# Patient Record
Sex: Male | Born: 1955 | Race: Black or African American | Hispanic: No | Marital: Married | Smoking: Never smoker
Health system: Southern US, Community
[De-identification: ages and names within clinical notes are randomized; demographics above are authoritative.]

## PROBLEM LIST (undated history)

## (undated) DIAGNOSIS — Z973 Presence of spectacles and contact lenses: Secondary | ICD-10-CM

## (undated) DIAGNOSIS — E786 Lipoprotein deficiency: Secondary | ICD-10-CM

## (undated) DIAGNOSIS — I10 Essential (primary) hypertension: Secondary | ICD-10-CM

## (undated) DIAGNOSIS — M169 Osteoarthritis of hip, unspecified: Secondary | ICD-10-CM

## (undated) DIAGNOSIS — M25552 Pain in left hip: Secondary | ICD-10-CM

## (undated) DIAGNOSIS — C61 Malignant neoplasm of prostate: Secondary | ICD-10-CM

## (undated) DIAGNOSIS — M25551 Pain in right hip: Secondary | ICD-10-CM

## (undated) DIAGNOSIS — Z8547 Personal history of malignant neoplasm of testis: Secondary | ICD-10-CM

## (undated) DIAGNOSIS — E785 Hyperlipidemia, unspecified: Secondary | ICD-10-CM

## (undated) DIAGNOSIS — K602 Anal fissure, unspecified: Secondary | ICD-10-CM

## (undated) HISTORY — DX: Personal history of malignant neoplasm of testis: Z85.47

## (undated) HISTORY — DX: Lipoprotein deficiency: E78.6

## (undated) HISTORY — DX: Essential (primary) hypertension: I10

## (undated) HISTORY — DX: Malignant neoplasm of prostate: C61

## (undated) HISTORY — DX: Hyperlipidemia, unspecified: E78.5

## (undated) HISTORY — DX: Osteoarthritis of hip, unspecified: M16.9

## (undated) HISTORY — DX: Pain in right hip: M25.551

## (undated) HISTORY — PX: OTHER SURGICAL HISTORY: SHX169

## (undated) HISTORY — DX: Anal fissure, unspecified: K60.2

## (undated) HISTORY — DX: Presence of spectacles and contact lenses: Z97.3

## (undated) HISTORY — DX: Pain in left hip: M25.552

---

## 1978-03-01 DIAGNOSIS — K602 Anal fissure, unspecified: Secondary | ICD-10-CM

## 1978-03-01 HISTORY — DX: Anal fissure, unspecified: K60.2

## 1995-03-02 HISTORY — PX: ORCHIECTOMY: SHX2116

## 1997-10-01 ENCOUNTER — Ambulatory Visit (HOSPITAL_COMMUNITY): Admission: RE | Admit: 1997-10-01 | Discharge: 1997-10-01 | Payer: Self-pay | Admitting: Radiation Oncology

## 1998-05-13 ENCOUNTER — Ambulatory Visit (HOSPITAL_COMMUNITY): Admission: RE | Admit: 1998-05-13 | Discharge: 1998-05-13 | Payer: Self-pay | Admitting: Radiation Oncology

## 1999-05-19 ENCOUNTER — Encounter: Admission: RE | Admit: 1999-05-19 | Discharge: 1999-08-17 | Payer: Self-pay | Admitting: Radiation Oncology

## 2000-05-17 ENCOUNTER — Ambulatory Visit: Admission: RE | Admit: 2000-05-17 | Discharge: 2000-05-17 | Payer: Self-pay | Admitting: Radiation Oncology

## 2001-06-06 ENCOUNTER — Ambulatory Visit (HOSPITAL_COMMUNITY): Admission: RE | Admit: 2001-06-06 | Discharge: 2001-06-06 | Payer: Self-pay | Admitting: Radiation Oncology

## 2008-03-01 HISTORY — PX: PROSTATE BIOPSY: SHX241

## 2008-12-13 ENCOUNTER — Ambulatory Visit: Payer: Self-pay | Admitting: Family Medicine

## 2009-02-14 ENCOUNTER — Ambulatory Visit: Payer: Self-pay | Admitting: Family Medicine

## 2009-07-29 ENCOUNTER — Ambulatory Visit: Payer: Self-pay | Admitting: Family Medicine

## 2010-12-03 ENCOUNTER — Encounter: Payer: Self-pay | Admitting: Family Medicine

## 2010-12-04 ENCOUNTER — Encounter: Payer: Self-pay | Admitting: Internal Medicine

## 2010-12-04 ENCOUNTER — Encounter: Payer: Self-pay | Admitting: Family Medicine

## 2010-12-04 ENCOUNTER — Ambulatory Visit (INDEPENDENT_AMBULATORY_CARE_PROVIDER_SITE_OTHER): Payer: BC Managed Care – PPO | Admitting: Family Medicine

## 2010-12-04 DIAGNOSIS — M129 Arthropathy, unspecified: Secondary | ICD-10-CM

## 2010-12-04 DIAGNOSIS — Z0000 Encounter for general adult medical examination without abnormal findings: Secondary | ICD-10-CM

## 2010-12-04 DIAGNOSIS — M1990 Unspecified osteoarthritis, unspecified site: Secondary | ICD-10-CM

## 2010-12-04 DIAGNOSIS — K922 Gastrointestinal hemorrhage, unspecified: Secondary | ICD-10-CM

## 2010-12-04 DIAGNOSIS — K602 Anal fissure, unspecified: Secondary | ICD-10-CM

## 2010-12-04 DIAGNOSIS — Z23 Encounter for immunization: Secondary | ICD-10-CM

## 2010-12-04 LAB — CBC WITH DIFFERENTIAL/PLATELET
Basophils Absolute: 0 10*3/uL (ref 0.0–0.1)
Basophils Relative: 1 % (ref 0–1)
Eosinophils Absolute: 0.1 10*3/uL (ref 0.0–0.7)
Eosinophils Relative: 2 % (ref 0–5)
HCT: 45.3 % (ref 39.0–52.0)
Hemoglobin: 15.3 g/dL (ref 13.0–17.0)
Lymphocytes Relative: 43 % (ref 12–46)
Lymphs Abs: 2.8 10*3/uL (ref 0.7–4.0)
MCH: 30.8 pg (ref 26.0–34.0)
MCHC: 33.8 g/dL (ref 30.0–36.0)
MCV: 91.1 fL (ref 78.0–100.0)
Monocytes Absolute: 0.4 10*3/uL (ref 0.1–1.0)
Monocytes Relative: 6 % (ref 3–12)
Neutro Abs: 3.2 10*3/uL (ref 1.7–7.7)
Neutrophils Relative %: 49 % (ref 43–77)
Platelets: 195 10*3/uL (ref 150–400)
RBC: 4.97 MIL/uL (ref 4.22–5.81)
RDW: 14.1 % (ref 11.5–15.5)
WBC: 6.5 10*3/uL (ref 4.0–10.5)

## 2010-12-04 LAB — COMPREHENSIVE METABOLIC PANEL
ALT: 16 U/L (ref 0–53)
AST: 14 U/L (ref 0–37)
Albumin: 4.3 g/dL (ref 3.5–5.2)
Alkaline Phosphatase: 51 U/L (ref 39–117)
BUN: 14 mg/dL (ref 6–23)
CO2: 25 mEq/L (ref 19–32)
Calcium: 9.6 mg/dL (ref 8.4–10.5)
Chloride: 104 mEq/L (ref 96–112)
Creat: 1.12 mg/dL (ref 0.50–1.35)
Glucose, Bld: 103 mg/dL — ABNORMAL HIGH (ref 70–99)
Potassium: 4 mEq/L (ref 3.5–5.3)
Sodium: 139 mEq/L (ref 135–145)
Total Bilirubin: 0.9 mg/dL (ref 0.3–1.2)
Total Protein: 7.2 g/dL (ref 6.0–8.3)

## 2010-12-04 LAB — LIPID PANEL
Cholesterol: 152 mg/dL (ref 0–200)
HDL: 25 mg/dL — ABNORMAL LOW (ref >39)
LDL Cholesterol: 93 mg/dL (ref 0–99)
Total CHOL/HDL Ratio: 6.1 Ratio
Triglycerides: 169 mg/dL — ABNORMAL HIGH (ref <150)
VLDL: 34 mg/dL (ref 0–40)

## 2010-12-04 NOTE — Progress Notes (Signed)
  Subjective:    Patient ID: Mark Valentine, male    DOB: May 14, 1955, 55 y.o.   MRN: [id number]  HPI He is here for evaluation of rectal bleeding. He has a previous history of rectal fissure and also states he in the past and had a fistula. Recently he has seen intermittent bright red blood but no pain. He spell no lesions. He has not had a colonoscopy. He has not seen any weight loss or melena. He does have bilateral hip pain and apparently told he needs hip replacements. He did see Dr. Thurston Hole but apparently did not like his personality.  Review of Systems     Objective:   Physical Exam Alert and in no distress. Exam of the anus shows no external lesions. Digital rectal exam did show a linear slightly tender lesion at 6:00. Rectal exam was guaiac positive. No lesions were palpable.       Assessment & Plan:  Guaiac positive stool. Bilateral hip pain. Routine blood screening done. I will also refer for colonoscopy. He will be getting in touch with a different orthopedic surgeon in near future to discuss hip replacement.

## 2010-12-07 ENCOUNTER — Other Ambulatory Visit: Payer: Self-pay

## 2010-12-07 MED ORDER — MELOXICAM 7.5 MG PO TABS: 7.5000 mg | ORAL_TABLET | Freq: Every day | ORAL | Status: DC

## 2010-12-07 NOTE — Telephone Encounter (Signed)
Pt called and med had not been called in

## 2010-12-16 ENCOUNTER — Ambulatory Visit: Payer: BC Managed Care – PPO | Admitting: Internal Medicine

## 2011-01-15 ENCOUNTER — Encounter: Payer: Self-pay | Admitting: *Deleted

## 2011-01-19 ENCOUNTER — Encounter: Payer: Self-pay | Admitting: Gastroenterology

## 2011-01-19 ENCOUNTER — Ambulatory Visit (INDEPENDENT_AMBULATORY_CARE_PROVIDER_SITE_OTHER): Payer: BC Managed Care – PPO | Admitting: Gastroenterology

## 2011-01-19 VITALS — BP 142/86 | HR 88 | Ht 69.5 in | Wt 227.6 lb

## 2011-01-19 DIAGNOSIS — Z8719 Personal history of other diseases of the digestive system: Secondary | ICD-10-CM | POA: Insufficient documentation

## 2011-01-19 DIAGNOSIS — Z9889 Other specified postprocedural states: Secondary | ICD-10-CM

## 2011-01-19 DIAGNOSIS — Z1211 Encounter for screening for malignant neoplasm of colon: Secondary | ICD-10-CM

## 2011-01-19 DIAGNOSIS — R195 Other fecal abnormalities: Secondary | ICD-10-CM | POA: Insufficient documentation

## 2011-01-19 MED ORDER — PEG-KCL-NACL-NASULF-NA ASC-C 100 G PO SOLR: 1.0000 | Freq: Once | ORAL | Status: DC

## 2011-01-19 NOTE — Patient Instructions (Signed)
You have been scheduled for a colonoscopy with propofol. Please follow written instructions given to you at your visit today.  Please pick up your prep kit at the pharmacy within the next 2-3 days.  

## 2011-01-19 NOTE — Progress Notes (Signed)
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 He continues urologic followup with Dr. Marcelyn Bruins, and apparently had a negative prostate biopsy one year ago. He has a history of recurrent rectal fissures with apparently a hysterectomy in 1980 at [facility]. He recently was seen by Dr.LaLonde had a superficial posterior fissure. Patient has occasional constipation but denies abdominal pain, melena, or hematochezia. Family history is noncontributory. He denies upper GI, hepatobiliary, or systemic complaints. He has not had previous colonoscopy. Review of his labs shows no evidence of anemia or abnormal liver function tests. Does take aspirin prophylactically 325 mg a day.  I have reviewed this patient's present history, medical and surgical past history, allergies and medications.     ROS: The remainder of the 10 point ROS is negative     Physical Exam: General well developed well nourished patient in no acute distress, appearing his stated age Eyes PERRLA, no icterus, fundoscopic exam per opthamologist Skin no lesions noted Neck supple, no adenopathy, no thyroid enlargement, no tenderness Chest clear to percussion and auscultation Heart no significant murmurs, gallops or rubs noted Abdomen no hepatosplenomegaly masses or tenderness, BS normal.  Extremities no acute joint lesions, edema, phlebitis or evidence of cellulitis. Neurologic patient oriented x 3, cranial nerves intact, no focal neurologic deficits noted. Psychological mental status normal and normal affect.  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 Apparently his Hemoccult card was positive at primary care. I have scheduled him for colonoscopy with propofol sedation. He is a prominent tongue on exam, but he denies a history of sleep apnea. He does not desire a new local anal creams at this  time.  No diagnosis found.

## 2011-02-01 ENCOUNTER — Ambulatory Visit (AMBULATORY_SURGERY_CENTER): Payer: BC Managed Care – PPO | Admitting: Gastroenterology

## 2011-02-01 ENCOUNTER — Encounter: Payer: Self-pay | Admitting: Gastroenterology

## 2011-02-01 DIAGNOSIS — R195 Other fecal abnormalities: Secondary | ICD-10-CM

## 2011-02-01 DIAGNOSIS — Z1211 Encounter for screening for malignant neoplasm of colon: Secondary | ICD-10-CM

## 2011-02-01 HISTORY — PX: COLONOSCOPY: SHX174

## 2011-02-01 MED ORDER — SODIUM CHLORIDE 0.9 % IV SOLN: 500.0000 mL | INTRAVENOUS | Status: DC

## 2011-02-01 NOTE — Progress Notes (Signed)
Patient did not experience any of the following events: a burn prior to discharge; a fall within the facility; wrong site/side/patient/procedure/implant event; or a hospital transfer or hospital admission upon discharge from the facility. (G8907) Patient did not have preoperative order for IV antibiotic SSI prophylaxis. (G8918)  

## 2011-02-01 NOTE — Patient Instructions (Signed)
NORMAL COLONOSCOPY  SEE BLUE AND GREEN SHEETS FOR ADDITIONAL D/C INSTRUCTIONS

## 2011-02-02 ENCOUNTER — Telehealth: Payer: Self-pay | Admitting: *Deleted

## 2011-02-02 NOTE — Telephone Encounter (Signed)

## 2011-05-18 ENCOUNTER — Emergency Department (HOSPITAL_BASED_OUTPATIENT_CLINIC_OR_DEPARTMENT_OTHER)
Admission: EM | Admit: 2011-05-18 | Discharge: 2011-05-18 | Disposition: A | Discharge status: Home / Self Care | Payer: BC Managed Care – PPO | Attending: Emergency Medicine | Admitting: Emergency Medicine

## 2011-05-18 DIAGNOSIS — M169 Osteoarthritis of hip, unspecified: Secondary | ICD-10-CM | POA: Insufficient documentation

## 2011-05-18 DIAGNOSIS — R5381 Other malaise: Secondary | ICD-10-CM | POA: Insufficient documentation

## 2011-05-18 DIAGNOSIS — I10 Essential (primary) hypertension: Secondary | ICD-10-CM | POA: Insufficient documentation

## 2011-05-18 DIAGNOSIS — M1610 Unilateral primary osteoarthritis, unspecified hip: Secondary | ICD-10-CM | POA: Insufficient documentation

## 2011-05-18 DIAGNOSIS — R6883 Chills (without fever): Secondary | ICD-10-CM | POA: Insufficient documentation

## 2011-05-18 DIAGNOSIS — R197 Diarrhea, unspecified: Secondary | ICD-10-CM

## 2011-05-18 LAB — CBC
MCH: 31 pg (ref 26.0–34.0)
MCHC: 34.1 g/dL (ref 30.0–36.0)
Platelets: 166 10*3/uL (ref 150–400)
RDW: 13.9 % (ref 11.5–15.5)

## 2011-05-18 LAB — URINALYSIS, ROUTINE W REFLEX MICROSCOPIC
Glucose, UA: NEGATIVE mg/dL
Ketones, ur: NEGATIVE mg/dL
Leukocytes, UA: NEGATIVE
Specific Gravity, Urine: 1.026 (ref 1.005–1.030)
pH: 5.5 (ref 5.0–8.0)

## 2011-05-18 LAB — COMPREHENSIVE METABOLIC PANEL
ALT: 20 U/L (ref 0–53)
Albumin: 3.6 g/dL (ref 3.5–5.2)
Alkaline Phosphatase: 49 U/L (ref 39–117)
Potassium: 3.9 mEq/L (ref 3.5–5.1)
Sodium: 138 mEq/L (ref 135–145)
Total Protein: 6.9 g/dL (ref 6.0–8.3)

## 2011-05-18 LAB — DIFFERENTIAL
Basophils Relative: 0 % (ref 0–1)
Eosinophils Absolute: 0 10*3/uL (ref 0.0–0.7)
Neutrophils Relative %: 67 % (ref 43–77)

## 2011-05-18 MED ORDER — SODIUM CHLORIDE 0.9 % IV BOLUS (SEPSIS)
1000.0000 mL | Freq: Once | INTRAVENOUS | Status: AC
  Administered 2011-05-18: 1000 mL via INTRAVENOUS

## 2011-05-18 NOTE — ED Provider Notes (Signed)
History     CSN: [id number]  Arrival date & time 05/18/11  1858   First MD Initiated Contact with Patient 05/18/11 2001      Chief Complaint  Patient presents with  . Diarrhea    Pt. reports he ate chicken from a service station last night.    (Consider location/radiation/quality/duration/timing/severity/associated sxs/prior treatment) HPI  C/o diarrhea since this morning 0300. Multiple episodes of NB diarrhea, watery and loose stool. Denies vomiting.  Has been taking immodium and gatorade. +subj fever +chills.  Wife sick one week ago with diarrhea. Also sick contacts- children at work. Also had chicken "that I thought was bad, I didn't eat it all". Diffuse weakness since onset. Denies hematuria/dysuria/freq/urgency. No recent abx use. No recent travel.  Past Medical History  Diagnosis Date  . Osteoarthritis of hip   . Hypertension   . Lipoprotein deficiencies   . History of testicular cancer   . Anal fissure 1980    Past Surgical History  Procedure Date  . Prostate biopsy 2010  . Orchiectomy 1997  . Anal fistula repair 1980's    Family History  Problem Relation Age of Onset  . Arthritis Mother   . Diabetes Mother   . Stroke Mother   . Hypertension Mother   . Heart disease Father   . Hypertension Sister   . Mental illness Sister   . Diabetes Sister   . Hypertension Brother     History  Substance Use Topics  . Smoking status: Never Smoker   . Smokeless tobacco: Never Used  . Alcohol Use: No      Review of Systems  All other systems reviewed and are negative.  except as noted HPI   Allergies  Review of patient's allergies indicates no known allergies.  Home Medications   Current Outpatient Rx  Name Route Sig Dispense Refill  . IBUPROFEN 200 MG PO TABS Oral Take 400 mg by mouth every 6 (six) hours as needed. For pain       BP 129/71  Pulse 74  Temp(Src) 99.7 F (37.6 C) (Oral)  Resp 18  Ht 5\' 9"  (1.753 m)  Wt 252 lb (114.306 kg)  BMI  37.21 kg/m2  SpO2 99%  Physical Exam  Nursing note and vitals reviewed. Constitutional: He is oriented to person, place, and time. He appears well-developed and well-nourished. No distress.  HENT:  Head: Atraumatic.       Mm dry  Eyes: Conjunctivae are normal. Pupils are equal, round, and reactive to light.  Neck: Neck supple.  Cardiovascular: Normal rate, regular rhythm, normal heart sounds and intact distal pulses.  Exam reveals no gallop and no friction rub.   No murmur heard. Pulmonary/Chest: Effort normal. No respiratory distress. He has no wheezes. He has no rales.  Abdominal: Soft. Bowel sounds are normal. There is no tenderness. There is no rebound and no guarding.  Musculoskeletal: Normal range of motion. He exhibits no edema and no tenderness.  Neurological: He is alert and oriented to person, place, and time.  Skin: Skin is warm and dry.  Psychiatric: He has a normal mood and affect.    ED Course  Procedures (including critical care time)  Labs Reviewed  DIFFERENTIAL - Abnormal; Notable for the following:    Monocytes Relative 14 (*)    All other components within normal limits  COMPREHENSIVE METABOLIC PANEL - Abnormal; Notable for the following:    Glucose, Bld 115 (*)    Creatinine, Ser 1.40 (*)  GFR calc non Af Amer 56 (*)    GFR calc Af Amer 64 (*)    All other components within normal limits  URINALYSIS, ROUTINE W REFLEX MICROSCOPIC - Abnormal; Notable for the following:    APPearance CLOUDY (*)    All other components within normal limits  CBC   No results found.   1. Diarrhea     MDM  Multiple episodes of NB diarrhea. No fever. Likely noninfectious. No recent antibiotic use. Feels better after hydration with IVF. Labs unremarkable. Plans for discharge home with PMD f/u for recheck Cr within one week. Patient aware.  [facility]      Forbes Cellar, MD 05/18/11 [phone]

## 2011-05-18 NOTE — ED Notes (Signed)
Pt. Reports he has had no solid food since the chicken he ate last night.  Pt. Reports he has had some gatoraid and ginger ale and then diarrhea.

## 2011-05-18 NOTE — Discharge Instructions (Signed)
Diarrhea Infections caused by germs (bacterial) or a virus commonly cause diarrhea. Your caregiver has determined that with time, rest and fluids, the diarrhea should improve. In general, eat normally while drinking more water than usual. Although water may prevent dehydration, it does not contain salt and minerals (electrolytes). Broths, weak tea without caffeine and oral rehydration solutions (ORS) replace fluids and electrolytes. Small amounts of fluids should be taken frequently. Large amounts at one time may not be tolerated. Plain water may be harmful in infants and the elderly. Oral rehydrating solutions (ORS) are available at pharmacies and grocery stores. ORS replace water and important electrolytes in proper proportions. Sports drinks are not as effective as ORS and may be harmful due to sugars worsening diarrhea.  ORS is especially recommended for use in children with diarrhea. As a general guideline for children, replace any new fluid losses from diarrhea and/or vomiting with ORS as follows:   If your child weighs 22 pounds or under (10 kg or less), give 60-120 mL ( -  cup or 2 - 4 ounces) of ORS for each episode of diarrheal stool or vomiting episode.   If your child weighs more than 22 pounds (more than 10 kgs), give 120-240 mL ( - 1 cup or 4 - 8 ounces) of ORS for each diarrheal stool or episode of vomiting.   While correcting for dehydration, children should eat normally. However, foods high in sugar should be avoided because this may worsen diarrhea. Large amounts of carbonated soft drinks, juice, gelatin desserts and other highly sugared drinks should be avoided.   After correction of dehydration, other liquids that are appealing to the child may be added. Children should drink small amounts of fluids frequently and fluids should be increased as tolerated. Children should drink enough fluids to keep urine clear or pale yellow.   Adults should eat normally while drinking more fluids  than usual. Drink small amounts of fluids frequently and increase as tolerated. Drink enough fluids to keep urine clear or pale yellow. Broths, weak decaffeinated tea, lemon lime soft drinks (allowed to go flat) and ORS replace fluids and electrolytes.   Avoid:   Carbonated drinks.   Juice.   Extremely hot or cold fluids.   Caffeine drinks.   Fatty, greasy foods.   Alcohol.   Tobacco.   Too much intake of anything at one time.   Gelatin desserts.   Probiotics are active cultures of beneficial bacteria. They may lessen the amount and number of diarrheal stools in adults. Probiotics can be found in yogurt with active cultures and in supplements.   Wash hands well to avoid spreading bacteria and virus.   Anti-diarrheal medications are not recommended for infants and children.   Only take over-the-counter or prescription medicines for pain, discomfort or fever as directed by your caregiver. Do not give aspirin to children because it may cause Reye's Syndrome.   For adults, ask your caregiver if you should continue all prescribed and over-the-counter medicines.   If your caregiver has given you a follow-up appointment, it is very important to keep that appointment. Not keeping the appointment could result in a chronic or permanent injury, and disability. If there is any problem keeping the appointment, you must call back to this facility for assistance.  SEEK IMMEDIATE MEDICAL CARE IF:   You or your child is unable to keep fluids down or other symptoms or problems become worse in spite of treatment.   Vomiting or diarrhea develops and becomes persistent.     There is vomiting of blood or bile (green material).   There is blood in the stool or the stools are black and tarry.   There is no urine output in 6-8 hours or there is only a small amount of very dark urine.   Abdominal pain develops, increases or localizes.   You have a fever.   Your baby is older than 3 months with a  rectal temperature of 102 F (38.9 C) or higher.   Your baby is 3 months old or younger with a rectal temperature of 100.4 F (38 C) or higher.   You or your child develops excessive weakness, dizziness, fainting or extreme thirst.   You or your child develops a rash, stiff neck, severe headache or become irritable or sleepy and difficult to awaken.  MAKE SURE YOU:   Understand these instructions.   Will watch your condition.   Will get help right away if you are not doing well or get worse.  Document Released: 02/05/2002 Document Revised: 02/04/2011 Document Reviewed: 12/23/2008 Exit[employer] Patient Information 2012 [employer]. 

## 2011-05-18 NOTE — ED Notes (Signed)
Pt. Reports diarrhea since 3am.  More than 5 diarrhea stools.  Pt. Has had 250bolus by EMS.

## 2011-05-31 ENCOUNTER — Encounter: Payer: Self-pay | Admitting: Medical

## 2011-05-31 ENCOUNTER — Ambulatory Visit (INDEPENDENT_AMBULATORY_CARE_PROVIDER_SITE_OTHER): Payer: BC Managed Care – PPO | Admitting: Medical

## 2011-05-31 VITALS — BP 138/82 | HR 72 | Temp 98.3°F | Resp 16 | Wt 220.0 lb

## 2011-05-31 DIAGNOSIS — R799 Abnormal finding of blood chemistry, unspecified: Secondary | ICD-10-CM

## 2011-05-31 DIAGNOSIS — R7989 Other specified abnormal findings of blood chemistry: Secondary | ICD-10-CM

## 2011-05-31 DIAGNOSIS — I10 Essential (primary) hypertension: Secondary | ICD-10-CM

## 2011-05-31 LAB — BASIC METABOLIC PANEL
CO2: 25 mEq/L (ref 19–32)
Chloride: 107 mEq/L (ref 96–112)
Creat: 1.07 mg/dL (ref 0.50–1.35)
Potassium: 3.9 mEq/L (ref 3.5–5.3)

## 2011-05-31 NOTE — Progress Notes (Signed)
Subjective:   HPI  Mark Valentine is a 56 y.o. male who presents for hospital follow up.  He was seen 05/18/11 at the med [facility] emergency department for acute viral gastroenteritis.  He was treated there with IV fluids over the course of about 8 hours then released. He apparently had elevated creatinine, and they advised he return here within a week for recheck.  He denies any other prior history of elevated creatinine. He also has questions about blood pressure. In the past he had been on medication briefly for high blood pressure throughout doctors here, however, he feels like it is just white coat hypertension. His pressure was elevated at the emergency department, but at the time of discharge it was back down to normal. He notes that he checks his blood pressure at home about 3 times per week, and he always has SBP in the 120s, DBPs in the 60 to 70s.  He does exercise some, tries to eat somewhat healthy.  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  He used to but be very active in martial arts.  No other c/o.  The following portions of the patient's history were reviewed and updated as appropriate: allergies, current medications, past family history, past medical history, past social history, past surgical history and problem list.  Past Medical History  Diagnosis Date  . Osteoarthritis of hip   . Hypertension   . Lipoprotein deficiencies   . History of testicular cancer   . Anal fissure 1980    No Known Allergies   Review of Systems ROS reviewed and was negative other than noted in HPI or above.    Objective:   Physical Exam  General appearance: alert, no distress, WD/WN Oral cavity: MMM, no lesions Neck: supple, no lymphadenopathy, no thyromegaly, no masses, no bruits Heart: RRR, normal S1, S2, no murmurs Lungs: CTA bilaterally, no wheezes, rhonchi, or rales Abdomen: +bs, soft, non tender, non distended,  no masses, no hepatomegaly, no splenomegaly Pulses: 2+ symmetric, upper and lower extremities, normal cap refill   Assessment and Plan :     Encounter Diagnoses  Name Primary?  . Essential hypertension, benign Yes  . Elevated serum creatinine     Hypertension-based on his home readings, he has been running a normal blood pressure.  His recent pressures here have been either normal or borderline, but he has had higher pressure readings back in 2010 when he was on medication.  He probably does have whitecoat hypertension.  At this point we will use a watch and wait approach. We discussed the risk of uncontrolled high blood pressure. We discussed lifestyle changes, regular exercise, eating healthy, avoiding salt, and recommend he try and lose at least 10 pounds. Recheck in a few months for a physical.  Of note, he may be having orthopedic surgery soon, and advise that this may be a good time to have a preventative appt/physical/pre surgery physical  Elevated serum creatinine-this was likely due to dehydration with gastroenteritis.  I reviewed the recent labs and emergency department visit notes, and we recheck a BMET today.  Of note his gastroenteritis has resolved completely.

## 2011-07-20 ENCOUNTER — Telehealth: Payer: Self-pay | Admitting: Internal Medicine

## 2011-07-20 NOTE — Telephone Encounter (Signed)
pls get the handicap sticker form from up front office, let me sign and we can fax/return this.

## 2011-07-20 NOTE — Telephone Encounter (Signed)
pt states got a new job and works in [employer] and has to have both hips replaced and pt wants to know if you will sign for him a handicap sticker. he has to build up time before he can have his surgery. please call pt to advise. he said he could mail [place] the papers or have his wife bring them to [place].

## 2011-07-21 NOTE — Telephone Encounter (Signed)
dmv form to chandra

## 2012-07-14 ENCOUNTER — Encounter: Payer: Self-pay | Admitting: Medical

## 2012-07-14 ENCOUNTER — Ambulatory Visit (INDEPENDENT_AMBULATORY_CARE_PROVIDER_SITE_OTHER): Payer: BC Managed Care – PPO | Admitting: Medical

## 2012-07-14 VITALS — BP 130/80 | HR 60 | Temp 97.7°F | Resp 16 | Ht 68.5 in | Wt 234.0 lb

## 2012-07-14 DIAGNOSIS — R972 Elevated prostate specific antigen [PSA]: Secondary | ICD-10-CM

## 2012-07-14 DIAGNOSIS — E669 Obesity, unspecified: Secondary | ICD-10-CM

## 2012-07-14 DIAGNOSIS — Z0000 Encounter for general adult medical examination without abnormal findings: Secondary | ICD-10-CM

## 2012-07-14 DIAGNOSIS — M25551 Pain in right hip: Secondary | ICD-10-CM

## 2012-07-14 DIAGNOSIS — M25552 Pain in left hip: Secondary | ICD-10-CM

## 2012-07-14 DIAGNOSIS — M25559 Pain in unspecified hip: Secondary | ICD-10-CM

## 2012-07-14 DIAGNOSIS — R9431 Abnormal electrocardiogram [ECG] [EKG]: Secondary | ICD-10-CM

## 2012-07-14 DIAGNOSIS — R7301 Impaired fasting glucose: Secondary | ICD-10-CM

## 2012-07-14 LAB — COMPREHENSIVE METABOLIC PANEL
Albumin: 4.2 g/dL (ref 3.5–5.2)
BUN: 12 mg/dL (ref 6–23)
CO2: 28 mEq/L (ref 19–32)
Calcium: 9.6 mg/dL (ref 8.4–10.5)
Chloride: 103 mEq/L (ref 96–112)
Glucose, Bld: 100 mg/dL — ABNORMAL HIGH (ref 70–99)
Potassium: 4.2 mEq/L (ref 3.5–5.3)
Total Protein: 6.8 g/dL (ref 6.0–8.3)

## 2012-07-14 LAB — CBC WITH DIFFERENTIAL/PLATELET
Hemoglobin: 15.9 g/dL (ref 13.0–17.0)
Lymphs Abs: 2.9 10*3/uL (ref 0.7–4.0)
Monocytes Relative: 7 % (ref 3–12)
Neutro Abs: 3.4 10*3/uL (ref 1.7–7.7)
Neutrophils Relative %: 48 % (ref 43–77)
Platelets: 199 10*3/uL (ref 150–400)
RBC: 5.18 MIL/uL (ref 4.22–5.81)
WBC: 7.1 10*3/uL (ref 4.0–10.5)

## 2012-07-14 LAB — POCT URINALYSIS DIPSTICK
Bilirubin, UA: NEGATIVE
Blood, UA: NEGATIVE
Glucose, UA: NEGATIVE
Ketones, UA: NEGATIVE
Spec Grav, UA: <=1.005
Urobilinogen, UA: NEGATIVE

## 2012-07-14 LAB — HEMOGLOBIN A1C: Mean Plasma Glucose: 117 mg/dL — ABNORMAL HIGH (ref <117)

## 2012-07-14 LAB — LIPID PANEL
Cholesterol: 155 mg/dL (ref 0–200)
Total CHOL/HDL Ratio: 6.2 Ratio
Triglycerides: 166 mg/dL — ABNORMAL HIGH (ref <150)
VLDL: 33 mg/dL (ref 0–40)

## 2012-07-14 MED ORDER — HYDROCODONE-ACETAMINOPHEN 5-325 MG PO TABS: 1.0000 | ORAL_TABLET | Freq: Four times a day (QID) | ORAL | Status: DC | PRN

## 2012-07-14 NOTE — Progress Notes (Signed)
Subjective:   HPI  Mark Valentine is a 57 y.o. male who presents for a complete physical.   Preventative care: Last ophthalmology visit: Last dental visit:yes- Dr. Amie Critchley Last colonoscopy:01/2011 Last prostate exam: 01/2011 Last EKG:12/13/08 Last labs:2013  Prior vaccinations: TD or Tdap:patient declined Influenza:2012 Pneumococcal:N/a Shingles/Zostavax:N/A  Advanced directive:N/a Health care power of attorney:N/a Living will:N/a  Concerns: bilat hip pain ongoing, saw ortho prior that recommended bilat hip surgery, but he wants to see different orthopedist as he wasn't comfortable with the surgeon.  Took so much NSAIDS in August 07, 2010 that he had colonoscopy due to blood in stool.   Reviewed their medical, surgical, family, social, medication, and allergy history and updated chart as appropriate.   Past Medical History  Diagnosis Date  . Osteoarthritis of hip   . Hypertension   . Lipoprotein deficiencies   . History of testicular cancer   . Anal fissure 1980  . Low HDL (under 40)   . Wears glasses   . Hip pain, bilateral   . Prostate nodule     elevated PSA, followed by Urology, Dr. Logan Bores    Past Surgical History  Procedure Laterality Date  . Prostate biopsy  06-Aug-2008    Dr. Logan Bores, Urology  . Orchiectomy  1997  . Anal fistula repair  1980's  . Colonoscopy  02/01/11    normal, repeat 06-Aug-2020; Dr. Jarold Motto    Family History  Problem Relation Age of Onset  . Arthritis Mother   . Diabetes Mother   . Stroke Mother   . Hypertension Mother   . Heart disease Father     aortic disease, possible dissection  . Hypertension Sister   . Mental illness Sister   . Diabetes Sister   . Hypertension Brother     History   Social History  . Marital Status: Widowed    Spouse Name: N/A    Number of Children: 1  . Years of Education: N/A   Occupational History  . Not on file.   Social History Main Topics  . Smoking status: Never Smoker   . Smokeless tobacco: Never Used   . Alcohol Use: No  . Drug Use: No  . Sexually Active: Not on file   Other Topics Concern  . Not on file   Social History Narrative   Married, limited exercise, former judge.  First wife passed away 07-Aug-1999.   Owns a Engineer, maintenance.  Commutes to [place] daily    No current outpatient prescriptions on file prior to visit.   No current facility-administered medications on file prior to visit.    No Known Allergies   Review of Systems Constitutional: -fever, -chills, -sweats, -unexpected weight change, -decreased appetite, -fatigue Allergy: -sneezing, -itching, -congestion Dermatology: -changing moles, --rash, -lumps ENT: -runny nose, -ear pain, -sore throat, -hoarseness, -sinus pain, -teeth pain, - ringing in ears, -hearing loss, -nosebleeds Cardiology: -chest pain, -palpitations, -swelling, -difficulty breathing when lying flat, -waking up short of breath Respiratory: -cough, -shortness of breath, -difficulty breathing with exercise or exertion, -wheezing, -coughing up blood Gastroenterology: -abdominal pain, -nausea, -vomiting, -diarrhea, -constipation, -blood in stool, -changes in bowel movement, -difficulty swallowing or eating Hematology: -bleeding, -bruising  Musculoskeletal: +joint aches, -muscle aches, -joint swelling, -back pain, -neck pain, -cramping, -changes in gait Ophthalmology: denies vision changes, eye redness, itching, discharge Urology: -burning with urination, -difficulty urinating, -blood in urine, -urinary frequency, -urgency, -incontinence Neurology: -headache, -weakness, -tingling, -numbness, -memory loss, -falls, -dizziness Psychology: -depressed mood, -agitation, -sleep problems     Objective:  Physical Exam  Filed Vitals:   07/14/12 0827  BP: 130/80  Pulse: 60  Temp: 97.7 F (36.5 C)  Resp: 16    General appearance: alert, no distress, WD/WN, AA male Skin: right upper back with medium to large pedunculated skin tag, several small skin tags  along neck, no worrisome lesions HEENT: normocephalic, conjunctiva/corneas normal, sclerae anicteric, PERRLA, EOMi, nares patent, no discharge or erythema, pharynx normal Oral cavity: MMM, tongue normal, teeth in good repair Neck: supple, no lymphadenopathy, no thyromegaly, no masses, normal ROM, no bruits Chest: non tender, normal shape and expansion Heart: RRR, normal S1, S2, no murmurs Lungs: CTA bilaterally, no wheezes, rhonchi, or rales Abdomen: +bs, soft, non tender, non distended, no masses, no hepatomegaly, no splenomegaly, no bruits Back: non tender, normal ROM, no scoliosis Musculoskeletal: limited bilat hip ROM, pain with hip ROM bilat, othewrise upper extremities non tender, no obvious deformity, normal ROM throughout, lower extremities non tender, no obvious deformity, normal ROM throughout Extremities: no edema, no cyanosis, no clubbing Pulses: 2+ symmetric, upper and lower extremities, normal cap refill Neurological: alert, oriented x 3, CN2-12 intact, strength normal upper extremities and lower extremities, sensation normal throughout, DTRs 2+ throughout, no cerebellar signs, gait normal Psychiatric: normal affect, behavior normal, pleasant  GU: normal male external genitalia, uncircumcised, only 1 testes present s/p orchiectomy, otherwise nontender, no masses, no hernia, no lymphadenopathy Rectal: anus normal tone, prostate firm, right sided nodule, occult negative stool   Adult ECG Report  Indication: physical, 2010 EKG with AV block type I  Rate: 63 bpm  Rhythm: normal sinus rhythm  QRS Axis: 46 degrees  PR Interval: 200 ms  QRS Duration: 78ms  QTc:  Conduction Disturbances: first-degree A-V block   Other Abnormalities: none  Patient's cardiac risk factors are: advanced age (older than 24 for men, 70 for women), hypertension, male gender, obesity (BMI >= 30 kg/m2) and sedentary lifestyle.  EKG comparison: 2010, unchanged  Narrative Interpretation: borderline AV  block type 1     Assessment and Plan :      Encounter Diagnoses  Name Primary?  . Routine general medical examination at a health care facility Yes  . Hip pain, bilateral   . Abnormal PSA   . Obesity, unspecified   . Impaired fasting blood sugar   . Nonspecific abnormal electrocardiogram (ECG) (EKG)     Physical exam - discussed healthy lifestyle, diet, exercise, preventative care, vaccinations, and addressed their concerns.  Advised yearly eye doctor visit.  He declines Tdap and vaccines although he is past due on Tdap.  Hip pain - prn use of Lortab, refer to Dr. Charlann Boxer, [facility]  Abnormal PSA, prostate nodule - will reqeust most recent urology notes  Obesity - consider swimming for exercise, reduced calorie diet to lose weight  impaired fasting glucose - labs today  reviewed EKG today compared to 2010 EKG   Follow-up pending labs

## 2012-07-14 NOTE — Addendum Note (Signed)
Addended by: Jac Canavan on: 07/14/2012 09:25 AM   Modules accepted: Orders

## 2012-07-18 ENCOUNTER — Other Ambulatory Visit: Payer: Self-pay | Admitting: Medical

## 2012-07-18 ENCOUNTER — Telehealth: Payer: Self-pay | Admitting: Family Medicine

## 2012-07-18 MED ORDER — NIACIN ER (ANTIHYPERLIPIDEMIC) 500 MG PO TBCR: 500.0000 mg | EXTENDED_RELEASE_TABLET | Freq: Every day | ORAL | Status: DC

## 2012-07-18 NOTE — Telephone Encounter (Signed)
Patient is aware of his appointment to see Dr. Charlann Boxer on June 2, 14 @ 800 am. [facility]. [phone]   Fax everything over through [facility]. CLS

## 2012-10-09 ENCOUNTER — Telehealth: Payer: Self-pay | Admitting: Medical

## 2012-10-09 NOTE — Telephone Encounter (Signed)
Pt was called and informed that Ernst Breach PA-C did receive the parking placard. Vincenza Hews did inform me thru a staff message that this would need to be filled out be Dr. Charlann Boxer.    I received handicap placard request. I assume this is related to hip pain. If so, he needs to send this to Dr. Charlann Boxer as he can give a better assessment of his disability or potential for long term disability.       Message was left for pt informing him of above message.

## 2012-11-23 ENCOUNTER — Ambulatory Visit (INDEPENDENT_AMBULATORY_CARE_PROVIDER_SITE_OTHER): Payer: BC Managed Care – PPO | Admitting: Family Medicine

## 2012-11-23 ENCOUNTER — Encounter: Payer: Self-pay | Admitting: Family Medicine

## 2012-11-23 VITALS — BP 130/80 | HR 70 | Temp 98.2°F | Wt 236.0 lb

## 2012-11-23 DIAGNOSIS — J069 Acute upper respiratory infection, unspecified: Secondary | ICD-10-CM

## 2012-11-23 NOTE — Progress Notes (Signed)
  Subjective:    Patient ID: Mark Valentine, male    DOB: [birth year], 56 y.o.   MRN: [id number]  HPI I've days ago he started having difficulty with a sore throat followed by PND, hoarse voice ,fatigue , malaise,diaphoresis. No cough, earache or chest congestion. He states he gets these symptoms usually twice a year usually in spring and fall. He is not having any sneezing, itchy watery eyes. He did try Zyrtec. He worked only one half day this week.   Review of Systems     Objective:   Physical Exam alert and in no distress. Tympanic membranes and canals are normal. Throat is clear. Tonsils are normal. Neck is supple without adenopathy or thyromegaly. Cardiac exam shows a regular sinus rhythm without murmurs or gallops. Lungs are clear to auscultation.        Assessment & Plan:  URI, acute  recommend supportive care. Will give him a note to return to work on Monday. Explained that I could not necessarily cover for the fact that he was out of work but will give him one as to when to return to work.

## 2012-11-23 NOTE — Patient Instructions (Signed)
Try Zyrtec-D for the runny nose and congestion. Take Tylenol or Advil for aches and pains

## 2013-06-06 ENCOUNTER — Encounter: Payer: Self-pay | Admitting: Family Medicine

## 2013-06-06 ENCOUNTER — Ambulatory Visit (INDEPENDENT_AMBULATORY_CARE_PROVIDER_SITE_OTHER): Payer: BC Managed Care – PPO | Admitting: Family Medicine

## 2013-06-06 VITALS — BP 120/90 | HR 80 | Wt 239.0 lb

## 2013-06-06 DIAGNOSIS — M25559 Pain in unspecified hip: Secondary | ICD-10-CM

## 2013-06-06 DIAGNOSIS — M25551 Pain in right hip: Secondary | ICD-10-CM

## 2013-06-06 DIAGNOSIS — M25552 Pain in left hip: Secondary | ICD-10-CM

## 2013-06-06 DIAGNOSIS — K602 Anal fissure, unspecified: Secondary | ICD-10-CM

## 2013-06-06 MED ORDER — CELECOXIB 200 MG PO CAPS: 200.0000 mg | ORAL_CAPSULE | Freq: Two times a day (BID) | ORAL | Status: DC

## 2013-06-06 MED ORDER — TRAMADOL HCL 50 MG PO TABS: 50.0000 mg | ORAL_TABLET | Freq: Three times a day (TID) | ORAL | Status: DC | PRN

## 2013-06-06 NOTE — Patient Instructions (Signed)
Plenty of fluids, bulk in your diet exercise as much is possible and listen to your body

## 2013-06-06 NOTE — Progress Notes (Signed)
   Subjective:    Patient ID: Mark Valentine, male    DOB: December 21, 1955, 58 y.o.   MRN: [id number]  HPI He complains of seeing bright red blood per rectum yesterday and to a lesser extent today. He has a previous history of anal fissure with repair. This occurred several years ago. He also is having bilateral hip pain and is supposed to get replacements however he is taking care of his mother which is interfering with him being able to get the surgery. He would like some medication to help with that.   Review of Systems     Objective:   Physical Exam Alert and in no distress. Anal exam does show a small fissure present at 6:00. Digital rectal exam did cause discomfort in that area.       Assessment & Plan:  Anal fissure  Bilateral hip pain - Plan: traMADol (ULTRAM) 50 MG tablet, celecoxib (CELEBREX) 200 MG capsule  recommend fluids, bulk in diet, exercise as much is possible and listing to his body in regard to having a BM. I will also switch him to Celebrex to help reduce GI toxicity. Sample of tramadol given. Informed him that if he continues to need the tramadol, this would definitely indicate need for the hip replacement.

## 2013-10-20 ENCOUNTER — Encounter (HOSPITAL_COMMUNITY): Payer: Self-pay | Admitting: Emergency Medicine

## 2013-10-20 ENCOUNTER — Emergency Department (INDEPENDENT_AMBULATORY_CARE_PROVIDER_SITE_OTHER)
Admission: EM | Admit: 2013-10-20 | Discharge: 2013-10-20 | Disposition: A | Discharge status: Home / Self Care | Payer: BC Managed Care – PPO | Source: Home / Self Care | Attending: Family Medicine | Admitting: Family Medicine

## 2013-10-20 DIAGNOSIS — R002 Palpitations: Secondary | ICD-10-CM

## 2013-10-20 LAB — POCT I-STAT, CHEM 8
BUN: 11 mg/dL (ref 6–23)
CREATININE: 1.1 mg/dL (ref 0.50–1.35)
Calcium, Ion: 1.17 mmol/L (ref 1.12–1.23)
Chloride: 105 mEq/L (ref 96–112)
Glucose, Bld: 107 mg/dL — ABNORMAL HIGH (ref 70–99)
HCT: 54 % — ABNORMAL HIGH (ref 39.0–52.0)
HEMOGLOBIN: 18.4 g/dL — AB (ref 13.0–17.0)
Potassium: 4.1 mEq/L (ref 3.7–5.3)
SODIUM: 141 meq/L (ref 137–147)
TCO2: 27 mmol/L (ref 0–100)

## 2013-10-20 LAB — TSH: TSH: 2.05 u[IU]/mL (ref 0.350–4.500)

## 2013-10-20 MED ORDER — METOPROLOL SUCCINATE ER 25 MG PO TB24: 25.0000 mg | ORAL_TABLET | Freq: Every day | ORAL | Status: DC

## 2013-10-20 NOTE — ED Provider Notes (Signed)
CSN: [id number]     Arrival date & time 10/20/13  0919 History   First MD Initiated Contact with Patient 10/20/13 [phone]     Chief Complaint  Patient presents with  . Tachycardia   (Consider location/radiation/quality/duration/timing/severity/associated sxs/prior Treatment) Patient is a 58 y.o. male presenting with palpitations. The history is provided by the patient.  Palpitations Palpitations quality:  Fast Onset quality:  Gradual Duration: onset 3 -4 d ago. Timing:  Intermittent Chronicity:  New Context: anxiety   Relieved by:  None tried Worsened by:  Nothing tried Associated symptoms: no chest pain, no chest pressure, no cough, no diaphoresis, no dizziness, no lower extremity edema and no shortness of breath   Risk factors: no hx of atrial fibrillation     Past Medical History  Diagnosis Date  . Osteoarthritis of hip   . Hypertension   . Lipoprotein deficiencies   . Anal fissure 1980  . Low HDL (under 40)   . Wears glasses   . Hip pain, bilateral   . Prostate nodule     elevated PSA, followed by Urology, Dr. Amalia Hailey  . History of testicular cancer    Past Surgical History  Procedure Laterality Date  . Prostate biopsy  2010    Dr. Amalia Hailey, Urology  . Orchiectomy  1997  . Anal fistula repair  1980's  . Colonoscopy  02/01/11    normal, repeat 2022; Dr. Sharlett Iles   Family History  Problem Relation Age of Onset  . Arthritis Mother   . Diabetes Mother   . Stroke Mother   . Hypertension Mother   . Heart disease Father     aortic disease, possible dissection  . Hypertension Sister   . Mental illness Sister   . Diabetes Sister   . Hypertension Brother    History  Substance Use Topics  . Smoking status: Never Smoker   . Smokeless tobacco: Never Used  . Alcohol Use: No    Review of Systems  Constitutional: Negative.  Negative for diaphoresis.  Respiratory: Negative for cough, chest tightness, shortness of breath and wheezing.   Cardiovascular: Positive for  palpitations. Negative for chest pain and leg swelling.  Gastrointestinal: Negative.   Neurological: Negative for dizziness.    Allergies  Review of patient's allergies indicates no known allergies.  Home Medications   Prior to Admission medications   Medication Sig Start Date End Date Taking? Authorizing Provider  Ibuprofen (ADVIL PO) Take by mouth.   Yes Historical Provider, MD  celecoxib (CELEBREX) 200 MG capsule Take 1 capsule (200 mg total) by mouth 2 (two) times daily. 06/06/13   Denita Lung, MD  cetirizine (ZYRTEC) 10 MG tablet Take 10 mg by mouth daily.    Historical Provider, MD  guaiFENesin (MUCINEX) 600 MG 12 hr tablet Take 1,200 mg by mouth 2 (two) times daily.    Historical Provider, MD  niacin (NIASPAN) 500 MG CR tablet Take 1 tablet (500 mg total) by mouth at bedtime. 07/18/12   Camelia Eng Tysinger, PA-C  traMADol (ULTRAM) 50 MG tablet Take 1 tablet (50 mg total) by mouth every 8 (eight) hours as needed. 06/06/13   Denita Lung, MD   BP 154/110  Pulse 73  Resp 16  SpO2 100% Physical Exam  Nursing note and vitals reviewed. Constitutional: He is oriented to person, place, and time. He appears well-developed and well-nourished.  Eyes: Conjunctivae are normal. Pupils are equal, round, and reactive to light.  Neck: Normal range of motion. Neck supple. No  thyromegaly present.  Cardiovascular: Normal heart sounds and intact distal pulses.   Pulmonary/Chest: Effort normal and breath sounds normal.  Musculoskeletal: He exhibits no edema.  Neurological: He is alert and oriented to person, place, and time.  Skin: Skin is warm and dry.    ED Course  Procedures (including critical care time) Labs Review Labs Reviewed  POCT I-STAT, CHEM 8 - Abnormal; Notable for the following:    Glucose, Bld 107 (*)    Hemoglobin 18.4 (*)    HCT 54.0 (*)    All other components within normal limits  TSH    Imaging Review No results found.  ecg -wnl. MDM   1. Rapid palpitations     Discussed with dr Johnsie Cancel, plans as noted.    Billy Fischer, MD 10/20/13 1017

## 2013-10-20 NOTE — Discharge Instructions (Signed)
Take medicine as prescribed, start today, see your doctor next week for recheck, he will refer you to dr Johnsie Cancel --cardiologist if needed.

## 2013-10-20 NOTE — ED Notes (Signed)
Patient reports episodes of fluttering/fast heart rate over the past week.  Episodes ar brief, do not accompany any other symptoms: no sob, no syncope, no nausea, no pain

## 2013-10-25 NOTE — ED Notes (Signed)
TSH 2.050.  8/25 Message to [facility] asking if I need to notify pt. of result.  8/26 Talked with Dr. Juventino Slovak and he said no need to call this result. Mark Valentine 10/25/2013

## 2013-11-26 ENCOUNTER — Ambulatory Visit: Payer: BC Managed Care – PPO | Admitting: Cardiovascular Disease

## 2013-12-12 ENCOUNTER — Encounter: Payer: Self-pay | Admitting: Cardiovascular Disease

## 2014-07-17 ENCOUNTER — Encounter: Payer: Self-pay | Admitting: Medical

## 2014-07-17 ENCOUNTER — Ambulatory Visit (INDEPENDENT_AMBULATORY_CARE_PROVIDER_SITE_OTHER): Payer: BC Managed Care – PPO | Admitting: Medical

## 2014-07-17 VITALS — BP 132/90 | HR 74 | Temp 98.4°F | Resp 14 | Ht 70.2 in | Wt 234.0 lb

## 2014-07-17 DIAGNOSIS — R972 Elevated prostate specific antigen [PSA]: Secondary | ICD-10-CM

## 2014-07-17 DIAGNOSIS — E782 Mixed hyperlipidemia: Secondary | ICD-10-CM

## 2014-07-17 DIAGNOSIS — R7301 Impaired fasting glucose: Secondary | ICD-10-CM | POA: Diagnosis not present

## 2014-07-17 DIAGNOSIS — M169 Osteoarthritis of hip, unspecified: Secondary | ICD-10-CM | POA: Diagnosis not present

## 2014-07-17 DIAGNOSIS — I10 Essential (primary) hypertension: Secondary | ICD-10-CM

## 2014-07-17 DIAGNOSIS — Z125 Encounter for screening for malignant neoplasm of prostate: Secondary | ICD-10-CM | POA: Diagnosis not present

## 2014-07-17 DIAGNOSIS — M25551 Pain in right hip: Secondary | ICD-10-CM | POA: Diagnosis not present

## 2014-07-17 DIAGNOSIS — E669 Obesity, unspecified: Secondary | ICD-10-CM | POA: Diagnosis not present

## 2014-07-17 DIAGNOSIS — M25552 Pain in left hip: Secondary | ICD-10-CM | POA: Diagnosis not present

## 2014-07-17 DIAGNOSIS — Z0000 Encounter for general adult medical examination without abnormal findings: Secondary | ICD-10-CM | POA: Diagnosis not present

## 2014-07-17 DIAGNOSIS — M1990 Unspecified osteoarthritis, unspecified site: Secondary | ICD-10-CM | POA: Insufficient documentation

## 2014-07-17 LAB — LIPID PANEL
CHOL/HDL RATIO: 6.1 ratio
Cholesterol: 147 mg/dL (ref 0–200)
HDL: 24 mg/dL — AB (ref >=40)
LDL CALC: 94 mg/dL (ref 0–99)
Triglycerides: 147 mg/dL (ref <150)
VLDL: 29 mg/dL (ref 0–40)

## 2014-07-17 LAB — COMPREHENSIVE METABOLIC PANEL
ALBUMIN: 4.1 g/dL (ref 3.5–5.2)
ALT: 20 U/L (ref 0–53)
AST: 18 U/L (ref 0–37)
Alkaline Phosphatase: 53 U/L (ref 39–117)
BUN: 8 mg/dL (ref 6–23)
CALCIUM: 9.2 mg/dL (ref 8.4–10.5)
CHLORIDE: 105 meq/L (ref 96–112)
CO2: 27 mEq/L (ref 19–32)
Creat: 0.91 mg/dL (ref 0.50–1.35)
GLUCOSE: 94 mg/dL (ref 70–99)
POTASSIUM: 4.2 meq/L (ref 3.5–5.3)
SODIUM: 140 meq/L (ref 135–145)
TOTAL PROTEIN: 7 g/dL (ref 6.0–8.3)
Total Bilirubin: 1 mg/dL (ref 0.2–1.2)

## 2014-07-17 LAB — CBC
HEMATOCRIT: 46.4 % (ref 39.0–52.0)
HEMOGLOBIN: 15.4 g/dL (ref 13.0–17.0)
MCH: 30.1 pg (ref 26.0–34.0)
MCHC: 33.2 g/dL (ref 30.0–36.0)
MCV: 90.8 fL (ref 78.0–100.0)
MPV: 10.7 fL (ref 8.6–12.4)
Platelets: 197 10*3/uL (ref 150–400)
RBC: 5.11 MIL/uL (ref 4.22–5.81)
RDW: 14.4 % (ref 11.5–15.5)
WBC: 6.9 10*3/uL (ref 4.0–10.5)

## 2014-07-17 MED ORDER — CELECOXIB 200 MG PO CAPS: 200.0000 mg | ORAL_CAPSULE | Freq: Two times a day (BID) | ORAL | Status: DC

## 2014-07-17 MED ORDER — HYDROCODONE-ACETAMINOPHEN 5-325 MG PO TABS: 1.0000 | ORAL_TABLET | Freq: Four times a day (QID) | ORAL | Status: DC | PRN

## 2014-07-17 NOTE — Progress Notes (Signed)
Subjective:   HPI  Mark Valentine is a 59 y.o. male who presents for a complete physical.  Last physical was here 2 years ago.  Medical care team includes:  Dr. Philipp Ovens, dentist  Eye doctor  Urology  Dorothea Ogle, PA-C here for primary care   Preventative care: Last ophthalmology visit: yes - next appointment this month- My eye doctor Last dental visit:  Yes - seen every now and then Dr. Philipp Ovens Last colonoscopy:2012 Last prostate exam: yes - 2012/06/25 Last EKG:09/2013 Last labs:5/ Jun 25, 2012  Prior vaccinations: TD or Tdap: <5 year ago Influenza: yes gets every year Pneumococcal: n/a Shingles/Zostavax:n/a  Concerns: Prior abnormal PSA - since last visit here saw Urology, had biopsy, labs normal, and was advised to return in 10 years or prn.  Main concern is his hip arthritis.  needs surgery.  He notes white coat hypertension.  Checks BPs at home.   Gets 120/70-80s most of the time.  If in pain, gets a little higher.  Currently taking care of his mother, driving 675 miles daily.  Doesn't think he can have surgery until she passes. She is going down hill.  Mom still has her mind, but physical not doing well.   Reviewed their medical, surgical, family, social, medication, and allergy history and updated chart as appropriate.  Past Medical History  Diagnosis Date  . Osteoarthritis of hip   . Hypertension   . Lipoprotein deficiencies   . Anal fissure 1980  . Low HDL (under 40)   . Wears glasses   . Hip pain, bilateral   . Prostate nodule     elevated PSA, followed by Urology, Dr. Amalia Hailey  . History of testicular cancer     Past Surgical History  Procedure Laterality Date  . Prostate biopsy  06/25/08    Dr. Amalia Hailey, Urology  . Orchiectomy  1997  . Anal fistula repair  1980's  . Colonoscopy  02/01/11    normal, repeat 25-Jun-2020; Dr. Sharlett Iles    History   Social History  . Marital Status: Married    Spouse Name: N/A  . Number of Children: 1  . Years of Education: N/A    Occupational History  . Not on file.   Social History Main Topics  . Smoking status: Never Smoker   . Smokeless tobacco: Never Used  . Alcohol Use: No  . Drug Use: No  . Sexual Activity: Not on file   Other Topics Concern  . Not on file   Social History Narrative   Married, limited exercise, former judge.  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  First wife passed away 06-26-99.  Has 1 daugthter.  Married.  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  Commutes to [employer] daily    Family History  Problem Relation Age of Onset  . Arthritis Mother   . Diabetes Mother   . Stroke Mother   . Hypertension Mother   . Heart disease Father     aortic disease, possible dissection  . Hypertension Sister   . Mental illness Sister   . Diabetes Sister   . Hypertension Brother      Current outpatient prescriptions:  .  Ibuprofen (ADVIL PO), Take by mouth., Disp: , Rfl:  .  celecoxib (CELEBREX) 200 MG capsule, Take 1 capsule (200 mg total) by mouth 2 (two) times daily. (Patient not taking: Reported on 07/17/2014), Disp: 60 capsule, Rfl: 5 .  cetirizine (ZYRTEC) 10 MG tablet, Take 10 mg by mouth daily.,  Disp: , Rfl:  .  guaiFENesin (MUCINEX) 600 MG 12 hr tablet, Take 1,200 mg by mouth 2 (two) times daily., Disp: , Rfl:  .  metoprolol succinate (TOPROL-XL) 25 MG 24 hr tablet, Take 1 tablet (25 mg total) by mouth daily. (Patient not taking: Reported on 07/17/2014), Disp: 30 tablet, Rfl: 1 .  niacin (NIASPAN) 500 MG CR tablet, Take 1 tablet (500 mg total) by mouth at bedtime. (Patient not taking: Reported on 07/17/2014), Disp: 30 tablet, Rfl: 3 .  traMADol (ULTRAM) 50 MG tablet, Take 1 tablet (50 mg total) by mouth every 8 (eight) hours as needed. (Patient not taking: Reported on 07/17/2014), Disp: 50 tablet, Rfl: 1  No Known Allergies    Review of Systems Constitutional: -fever, -chills, -sweats, -unexpected weight change, -decreased appetite, -fatigue Allergy: -sneezing, -itching,  -congestion Dermatology: -changing moles, --rash, -lumps ENT: -runny nose, -ear pain, -sore throat, -hoarseness, -sinus pain, -teeth pain, - ringing in ears, -hearing loss, -nosebleeds Cardiology: -chest pain, -palpitations, -swelling, -difficulty breathing when lying flat, -waking up short of breath Respiratory: -cough, -shortness of breath, -difficulty breathing with exercise or exertion, -wheezing, -coughing up blood Gastroenterology: -abdominal pain, -nausea, -vomiting, -diarrhea, -constipation, -blood in stool, -changes in bowel movement, -difficulty swallowing or eating Hematology: -bleeding, -bruising  Musculoskeletal: +joint aches, -muscle aches, -joint swelling, -back pain, -neck pain, -cramping, -changes in gait Ophthalmology: denies vision changes, eye redness, itching, discharge Urology: -burning with urination, -difficulty urinating, -blood in urine, -urinary frequency, -urgency, -incontinence Neurology: -headache, -weakness, -tingling, -numbness, -memory loss, -falls, -dizziness Psychology: -depressed mood, -agitation, -sleep problems     Objective:   Physical Exam  BP 132/90 mmHg  Pulse 74  Temp(Src) 98.4 F (36.9 C) (Oral)  Resp 14  Ht 5' 10.2" (1.783 m)  Wt 234 lb (106.142 kg)  BMI 33.39 kg/m2  General appearance: alert, no distress, WD/WN, AA male Skin: right upper back with medium to large pedunculated skin tag, several small skin tags along neck, no worrisome lesions HEENT: normocephalic, conjunctiva/corneas normal, sclerae anicteric, PERRLA, EOMi, nares patent, no discharge or erythema, pharynx normal Oral cavity: MMM, tongue normal, teeth in good repair Neck: supple, no lymphadenopathy, no thyromegaly, no masses, normal ROM, no bruits Chest: non tender, normal shape and expansion Heart: RRR, normal S1, S2, no murmurs Lungs: CTA bilaterally, no wheezes, rhonchi, or rales Abdomen: +bs, soft, non tender, non distended, no masses, no hepatomegaly, no splenomegaly,  no bruits Back: non tender, normal ROM, no scoliosis Musculoskeletal: limited bilat hip ROM, pain with hip ROM bilat, otherwise upper extremities non tender, no obvious deformity, normal ROM throughout, lower extremities non tender, no obvious deformity, normal ROM throughout Extremities: no edema, no cyanosis, no clubbing Pulses: 2+ symmetric, upper and lower extremities, normal cap refill Neurological: alert, oriented x 3, CN2-12 intact, strength normal upper extremities and lower extremities, sensation normal throughout, DTRs 2+ throughout, no cerebellar signs, gait normal Psychiatric: normal affect, behavior normal, pleasant  GU: normal male external genitalia, uncircumcised, only 1 testes present on left, s/p orchiectomy, right upper groin surgical scar, otherwise nontender, no masses, no hernia, no lymphadenopathy Rectal: anus normal tone, prostate firm, right sided nodule unchanged, occult negative stool   Assessment and Plan :    Encounter Diagnoses  Name Primary?  . Encounter for health maintenance examination in adult Yes  . Mixed dyslipidemia   . Impaired fasting blood sugar   . Essential hypertension   . Osteoarthritis of hip, unspecified laterality, unspecified osteoarthritis type   . Obesity   .  Screening for prostate cancer   . Abnormal PSA    Physical exam - discussed healthy lifestyle, diet, exercise, preventative care, vaccinations, and addressed their concerns.   Advised they see a dentist yearly for routine dental care including hygiene visits twice yearly. Advised they see an eye doctor yearly for routine vision care. Routine screening labs today  Vaccinations: Discussed appropriate vaccines including Tdap, Influenza.  Other concerns today: Obesity - work on lifestyle changes and weight loss  Follow up pending labs

## 2014-07-18 ENCOUNTER — Telehealth: Payer: Self-pay | Admitting: Medical

## 2014-07-18 LAB — MICROALBUMIN / CREATININE URINE RATIO
CREATININE, URINE: 96.5 mg/dL
MICROALB UR: 0.2 mg/dL (ref <2.0)
MICROALB/CREAT RATIO: 2.1 mg/g (ref 0.0–30.0)

## 2014-07-18 LAB — HEMOGLOBIN A1C
Hgb A1c MFr Bld: 6 % — ABNORMAL HIGH (ref <5.7)
MEAN PLASMA GLUCOSE: 126 mg/dL — AB (ref <117)

## 2014-07-18 LAB — PSA: PSA: 17.04 ng/mL — ABNORMAL HIGH (ref <=4.00)

## 2014-07-18 NOTE — Telephone Encounter (Signed)
[facility] urology [phone])  and they will fax over records. Pt was seen back in 2011. Pt was seen at [facility] [phone]) back in 07 for hip and they will fax over notes for that as well.

## 2014-07-18 NOTE — Telephone Encounter (Signed)
We sent him to ortho off [address] just 2 years ago

## 2014-07-18 NOTE — Telephone Encounter (Signed)
Please hunt down prior Urology and Ortho notes . We referred to Urology 2 years ago for abnormal PSA and ortho for hip arthritis.

## 2014-07-18 NOTE — Telephone Encounter (Signed)
I have called all the orthopedics that i know of on [address] and [facility]alie[facility] Ip is the only one that has seen him back in 2011 for hip.

## 2014-07-24 ENCOUNTER — Telehealth: Payer: Self-pay | Admitting: Family Medicine

## 2014-07-24 NOTE — Telephone Encounter (Signed)
Patient called today. He states that [facility] PA told him to call back this week in reference to receiving OV notes from his urologist Dr. Amalia Hailey and some results. Please, advise if you have some information for him.   # [phone]

## 2014-07-25 ENCOUNTER — Telehealth: Payer: Self-pay | Admitting: Medical

## 2014-07-25 NOTE — Telephone Encounter (Signed)
Patient is aware of the message from [facility] PA and he has agreed to see Dr. Amalia Hailey. Patient is aware of his appointment on August 22, 2014 @ 900 am. [address] [place], [place]

## 2014-07-25 NOTE — Telephone Encounter (Signed)
i received prior records, but they were from 2010 and PSA now is much higher.  I recommend Urology f/u.   Does he want to go back to [facility]?   Lets refer.

## 2014-07-25 NOTE — Telephone Encounter (Signed)
Initiated Prior Auth for Celecoxib 200mg 

## 2014-07-26 NOTE — Telephone Encounter (Signed)
Prior Auth approved effective 07/04/14 - 07/25/15

## 2014-08-30 ENCOUNTER — Encounter: Payer: Self-pay | Admitting: Medical

## 2015-03-02 DIAGNOSIS — C61 Malignant neoplasm of prostate: Secondary | ICD-10-CM

## 2015-03-02 HISTORY — DX: Malignant neoplasm of prostate: C61

## 2015-05-31 HISTORY — PX: PROSTATECTOMY: SHX69

## 2015-06-17 ENCOUNTER — Telehealth: Payer: Self-pay | Admitting: Medical

## 2015-06-17 NOTE — Telephone Encounter (Signed)
Schedule him for yearly physical soon per date from last physical.

## 2015-06-18 NOTE — Telephone Encounter (Signed)
Called and left message with pt he needs one after May the 18th. Told him to call us back

## 2015-06-18 NOTE — Telephone Encounter (Signed)
Pt called back and states that he just had surgery and has a lot of appt and will call back to schedule his cpe when he knows when he is available in may.

## 2015-08-04 ENCOUNTER — Telehealth: Payer: Self-pay | Admitting: Medical

## 2015-08-04 NOTE — Telephone Encounter (Signed)
Left on pts personal VM

## 2015-08-04 NOTE — Telephone Encounter (Signed)
I recommend Dr. Zollie Beckers at [facility]

## 2015-08-04 NOTE — Telephone Encounter (Signed)
Pt states had prostate cancer & surgery.  Also needs bilateral hip replacement and doesn't want to go to doctor in [place] would like to know who you recommend here for his hips.  Also he knows he needs an appt here ASAP. Please call pt & advise

## 2015-08-05 ENCOUNTER — Encounter: Payer: Self-pay | Admitting: Family Medicine

## 2015-08-05 ENCOUNTER — Ambulatory Visit (INDEPENDENT_AMBULATORY_CARE_PROVIDER_SITE_OTHER): Payer: BC Managed Care – PPO | Admitting: Family Medicine

## 2015-08-05 VITALS — BP 130/78 | HR 90 | Wt 242.0 lb

## 2015-08-05 DIAGNOSIS — M545 Low back pain, unspecified: Secondary | ICD-10-CM

## 2015-08-05 DIAGNOSIS — S40021A Contusion of right upper arm, initial encounter: Secondary | ICD-10-CM | POA: Diagnosis not present

## 2015-08-05 NOTE — Patient Instructions (Signed)
4 Advil 3 times per day. Ice for 20 minutes 3 times per day for the next couple of days then switch to heat .

## 2015-08-05 NOTE — Progress Notes (Signed)
   Subjective:    Patient ID: Mark Valentine, male    DOB: [birth year], 60 y.o.   MRN: [id number]  HPI He fell this morning due to hip troubles that he is having. He then injured his right arm and several hours after that had some left-sided low back pain. He thinks that this will interfere with his ability to work because he has to drive to [place] everyday.   Review of Systems     Objective:   Physical Exam Alert and in no distress. Tender to palpation in the right distal humeral area laterally. No bony tenderness. Elbow is nontender with full motion. Exam of his back does show some slight tenderness to palpation in the mid back paravertebral muscles with good motion of his back.       Assessment & Plan:  Arm contusion, right, initial encounter  Left-sided low back pain without sciatica Recommend ice for 20 minutes 3 times per day as well as anti-inflammatory. Did give him a note for out of work for 2 days mainly due to his having to drive to [place] with his back pain.

## 2015-08-06 ENCOUNTER — Telehealth: Payer: Self-pay | Admitting: Family Medicine

## 2015-08-06 DIAGNOSIS — E669 Obesity, unspecified: Secondary | ICD-10-CM

## 2015-08-06 NOTE — Telephone Encounter (Signed)
Call in tramadol 50mg  20 one every four hours for pain

## 2015-08-06 NOTE — Telephone Encounter (Signed)
Pt called and stated that he is in more pain today. He states pain not responding to OTC meds recommended. He is requesting something stronger. Pt can be reached at [phone] and uses [facility].

## 2015-08-07 ENCOUNTER — Other Ambulatory Visit: Payer: Self-pay

## 2015-08-07 MED ORDER — TRAMADOL HCL 50 MG PO TABS: 50.0000 mg | ORAL_TABLET | ORAL | Status: DC | PRN

## 2015-08-07 MED ORDER — TRAMADOL HCL 50 MG PO TABS: 50.0000 mg | ORAL_TABLET | Freq: Three times a day (TID) | ORAL | Status: DC | PRN

## 2015-08-07 NOTE — Telephone Encounter (Signed)
Called in med in per [employer]

## 2015-08-15 ENCOUNTER — Telehealth: Payer: Self-pay | Admitting: Family Medicine

## 2015-08-15 NOTE — Telephone Encounter (Signed)
Pt called and states that he is still in a lot of pain and that his right arm/elbow is swollen and looks like it has fluid built up around it. 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 Pt can be reached at [phone] (M) and [id number] [place] [place] said if someone could please call him with instructions

## 2015-08-15 NOTE — Telephone Encounter (Signed)
Left message for pt word for word

## 2015-08-15 NOTE — Telephone Encounter (Signed)
Schedule him to be seen next week and continue on pain meds.

## 2015-08-18 ENCOUNTER — Encounter: Payer: Self-pay | Admitting: Family Medicine

## 2015-08-18 ENCOUNTER — Ambulatory Visit (INDEPENDENT_AMBULATORY_CARE_PROVIDER_SITE_OTHER): Payer: BC Managed Care – PPO | Admitting: Family Medicine

## 2015-08-18 ENCOUNTER — Telehealth: Payer: Self-pay

## 2015-08-18 ENCOUNTER — Ambulatory Visit
Admission: RE | Admit: 2015-08-18 | Discharge: 2015-08-18 | Disposition: A | Discharge status: Home / Self Care | Payer: BC Managed Care – PPO | Source: Ambulatory Visit | Attending: Family Medicine | Admitting: Family Medicine

## 2015-08-18 VITALS — BP 138/80 | HR 72 | Wt 240.6 lb

## 2015-08-18 DIAGNOSIS — M25552 Pain in left hip: Secondary | ICD-10-CM | POA: Diagnosis not present

## 2015-08-18 DIAGNOSIS — C61 Malignant neoplasm of prostate: Secondary | ICD-10-CM | POA: Diagnosis not present

## 2015-08-18 DIAGNOSIS — S40021A Contusion of right upper arm, initial encounter: Secondary | ICD-10-CM | POA: Diagnosis not present

## 2015-08-18 DIAGNOSIS — M25551 Pain in right hip: Secondary | ICD-10-CM | POA: Diagnosis not present

## 2015-08-18 NOTE — Telephone Encounter (Signed)
Called pt in regards to referral for ortho. At first, he declined appt with Dr.Kendall stating he would address his concerns with Dr. Charm Rings. Advised pt that Dr. Delilah Shan is a knee and hip specialist, and may not address his elbow pain.   Patient ask that I return call to ortho to confirm that Dr. Charm Rings appt was not cancelled and it was not.  Called pt back to inform him of this and pt states now that he wants to keep appt with Dr. Delilah Shan as well.  Pt is aware of the location and appt time. He has had x-ray completed and our office will fax notes and report to [phone]

## 2015-08-18 NOTE — Progress Notes (Signed)
   Subjective:    Patient ID: Mark Valentine, male    DOB: 04/08/1955, 60 y.o.   MRN: [id number]  HPI He is here for recheck. He continues to have right elbow pain as well as some back discomfort. He states that the elbow discomfort is interfering with his ability to drive and work. He states that typing was quite difficult as well as using the right arm in general for any activity. He also notes discomfort even when he is in bed. He is scheduled to see Dr.Olin for follow-up on bilateral hip pain and possible replacement. He had robotic prostate surgery on April 5. He seems to be recovering fairly predictably on that.   Review of Systems     Objective:   Physical Exam Tender to palpation over the medial and lateral epicondyle. Some ecchymosis is noted to the medial aspect of the forearm. Good motion of the elbow.       Assessment & Plan:  Arm contusion, right, initial encounter - Plan: DG Elbow Complete Right, Ambulatory referral to Orthopedic Surgery  Bilateral hip pain  Prostate cancer ([place]) The x-ray was negative. At the end of the encounter he then asked about driving to work and I explained that he should be able to continue to do this.

## 2015-09-08 ENCOUNTER — Telehealth: Payer: Self-pay | Admitting: Medical

## 2015-09-08 NOTE — Telephone Encounter (Signed)
Schedule him a physical and surgery clearance visit.  He has surgery very soon, and he is due for CPX

## 2015-09-09 NOTE — Telephone Encounter (Signed)
Spoke to pt appt is made and he did have some confusion on what he needed pre ops for. I went over what he needed here but he would need to contact his orthopedics to discuss theirs. Pt is a little frustrated but started to calm down at the end of the phone call. Shane ok'd pts appt to be his 3rd morning physical

## 2015-09-09 NOTE — Telephone Encounter (Signed)
The orthopedic office sent me a preop letter to complete which request a preop visit/physical visit.   Plus he is due anyway.

## 2015-09-09 NOTE — Telephone Encounter (Signed)
Pt states he will schedule a cpe later he couldn't afford a lot of copays right now, and he is having a pre op with dr Roxy Manns on 09/16/2015 and didn't see why he would need to come in here and there.

## 2015-09-09 NOTE — Telephone Encounter (Signed)
LMTCB

## 2015-09-16 NOTE — H&P (Signed)
TOTAL HIP ADMISSION H&P  Patient is admitted for right total hip arthroplasty, anterior approach.  Subjective:  Chief Complaint:    Right hip primary OA / pain  HPI: Mark Valentine, 60 y.o. male, has a history of pain and functional disability in the right hip(s) due to arthritis and patient has failed non-surgical conservative treatments for greater than 12 weeks to include NSAID's and/or analgesics and activity modification.  Onset of symptoms was gradual starting 15+ years ago with gradually worsening course since that time.The patient noted no past surgery on the right hip(s).  Patient currently rates pain in the right hip at 10 out of 10 with activity. Patient has night pain, worsening of pain with activity and weight bearing, trendelenberg gait, pain that interfers with activities of daily living and pain with passive range of motion. Patient has evidence of periarticular osteophytes and joint space narrowing by imaging studies. This condition presents safety issues increasing the risk of falls. There is no current active infection.   Risks, benefits and expectations were discussed with the patient.  Risks including but not limited to the risk of anesthesia, blood clots, nerve damage, blood vessel damage, failure of the prosthesis, infection and up to and including death.  Patient understand the risks, benefits and expectations and wishes to proceed with surgery.   PCP: Mark Oxford, PA-C  D/C Plans:      Home  Post-op Meds:       No Rx given  Tranexamic Acid:      To be given - IV   Decadron:      Is to be given  FYI:     ASA  Norco    Patient Active Problem List   Diagnosis Date Noted  . 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 Essential hypertension 07/17/2014  . Impaired fasting blood sugar 07/17/2014  . Mixed dyslipidemia 07/17/2014  . Obesity 07/17/2014   Past Medical History  Diagnosis Date  . Osteoarthritis of hip   . Hypertension   . Lipoprotein deficiencies    . Anal fissure 1980  . Low HDL (under 40)   . Wears glasses   . Hip pain, bilateral   . Prostate nodule     elevated PSA, followed by Urology, Dr. Amalia Valentine  . History of testicular cancer     Past Surgical History  Procedure Laterality Date  . Prostate biopsy  2010    Dr. Amalia Valentine, Urology  . Orchiectomy  1997  . Anal fistula repair  1980's  . Colonoscopy  02/01/11    normal, repeat 2022; Dr. Sharlett Valentine    No prescriptions prior to admission   Allergies  Allergen Reactions  . Mobic [Meloxicam] Other (See Comments)    bleeding    Social History  Substance Use Topics  . Smoking status: Never Smoker   . Smokeless tobacco: Never Used  . Alcohol Use: No    Family History  Problem Relation Age of Onset  . Arthritis Mother   . Diabetes Mother   . Stroke Mother   . Hypertension Mother   . Heart disease Father     aortic disease, possible dissection  . Hypertension Sister   . Mental illness Sister   . Diabetes Sister   . Hypertension Brother      Review of Systems  Constitutional: Negative.   HENT: Negative.   Eyes: Negative.   Respiratory: Negative.   Cardiovascular: Negative.   Gastrointestinal: Negative.   Genitourinary: Positive for frequency.  Musculoskeletal: Positive for joint  pain.  Skin: Negative.   Neurological: Negative.   Endo/Heme/Allergies: Negative.   Psychiatric/Behavioral: Negative.     Objective:  Physical Exam  Constitutional: He is oriented to person, place, and time. He appears well-developed.  HENT:  Head: Normocephalic.  Eyes: Pupils are equal, round, and reactive to light.  Neck: Neck supple. No JVD present. No tracheal deviation present. No thyromegaly present.  Cardiovascular: Normal rate, regular rhythm, normal heart sounds and intact distal pulses.   Respiratory: Effort normal and breath sounds normal. No stridor. No respiratory distress. He has no wheezes.  GI: Soft. There is no tenderness. There is no guarding.  Lymphadenopathy:     He has no cervical adenopathy.  Neurological: He is alert and oriented to person, place, and time.  Skin: Skin is warm and dry.  Psychiatric: He has a normal mood and affect.      Labs:  Estimated body mass index is 34.33 kg/(m^2) as calculated from the following:   Height as of 07/17/14: 5' 10.2" (1.783 m).   Weight as of 08/18/15: 109.135 kg (240 lb 9.6 oz).   Imaging Review Plain radiographs demonstrate severe degenerative joint disease of the right hip(s). The bone quality appears to be good for age and reported activity level.  Assessment/Plan:  End stage arthritis, right hip(s)  The patient history, physical examination, clinical judgement of the provider and imaging studies are consistent with end stage degenerative joint disease of the right hip(s) and total hip arthroplasty is deemed medically necessary. The treatment options including medical management, injection therapy, arthroscopy and arthroplasty were discussed at length. The risks and benefits of total hip arthroplasty were presented and reviewed. The risks due to aseptic loosening, infection, stiffness, dislocation/subluxation,  thromboembolic complications and other imponderables were discussed.  The patient acknowledged the explanation, agreed to proceed with the plan and consent was signed. Patient is being admitted for inpatient treatment for surgery, pain control, PT, OT, prophylactic antibiotics, VTE prophylaxis, progressive ambulation and ADL's and discharge planning.The patient is planning to be discharged home with home health services.    Mark Pugh Brenner Visconti   PA-C  09/16/2015, 1:33 PM

## 2015-09-17 ENCOUNTER — Encounter: Payer: Self-pay | Admitting: Medical

## 2015-09-17 ENCOUNTER — Ambulatory Visit (INDEPENDENT_AMBULATORY_CARE_PROVIDER_SITE_OTHER): Payer: BC Managed Care – PPO | Admitting: Medical

## 2015-09-17 VITALS — BP 140/88 | HR 75 | Wt 238.0 lb

## 2015-09-17 DIAGNOSIS — Z0000 Encounter for general adult medical examination without abnormal findings: Secondary | ICD-10-CM | POA: Insufficient documentation

## 2015-09-17 DIAGNOSIS — Z01818 Encounter for other preprocedural examination: Secondary | ICD-10-CM | POA: Diagnosis not present

## 2015-09-17 DIAGNOSIS — I10 Essential (primary) hypertension: Secondary | ICD-10-CM

## 2015-09-17 DIAGNOSIS — E669 Obesity, unspecified: Secondary | ICD-10-CM | POA: Diagnosis not present

## 2015-09-17 DIAGNOSIS — Z7185 Encounter for immunization safety counseling: Secondary | ICD-10-CM | POA: Insufficient documentation

## 2015-09-17 DIAGNOSIS — R7301 Impaired fasting glucose: Secondary | ICD-10-CM

## 2015-09-17 DIAGNOSIS — E782 Mixed hyperlipidemia: Secondary | ICD-10-CM

## 2015-09-17 DIAGNOSIS — C61 Malignant neoplasm of prostate: Secondary | ICD-10-CM

## 2015-09-17 DIAGNOSIS — Z7189 Other specified counseling: Secondary | ICD-10-CM | POA: Diagnosis not present

## 2015-09-17 LAB — COMPREHENSIVE METABOLIC PANEL
ALK PHOS: 47 U/L (ref 40–115)
ALT: 22 U/L (ref 9–46)
AST: 17 U/L (ref 10–35)
Albumin: 4.2 g/dL (ref 3.6–5.1)
BILIRUBIN TOTAL: 0.7 mg/dL (ref 0.2–1.2)
BUN: 12 mg/dL (ref 7–25)
CO2: 26 mmol/L (ref 20–31)
CREATININE: 1.04 mg/dL (ref 0.70–1.33)
Calcium: 9.3 mg/dL (ref 8.6–10.3)
Chloride: 103 mmol/L (ref 98–110)
GLUCOSE: 104 mg/dL — AB (ref 65–99)
POTASSIUM: 4.2 mmol/L (ref 3.5–5.3)
SODIUM: 140 mmol/L (ref 135–146)
TOTAL PROTEIN: 7.2 g/dL (ref 6.1–8.1)

## 2015-09-17 LAB — CBC WITH DIFFERENTIAL/PLATELET
BASOS PCT: 1 %
Basophils Absolute: 68 cells/uL (ref 0–200)
EOS ABS: 272 {cells}/uL (ref 15–500)
Eosinophils Relative: 4 %
HEMATOCRIT: 48.1 % (ref 38.5–50.0)
Hemoglobin: 16.3 g/dL (ref 13.2–17.1)
LYMPHS PCT: 44 %
Lymphs Abs: 2992 cells/uL (ref 850–3900)
MCH: 31 pg (ref 27.0–33.0)
MCHC: 33.9 g/dL (ref 32.0–36.0)
MCV: 91.4 fL (ref 80.0–100.0)
MONO ABS: 476 {cells}/uL (ref 200–950)
MPV: 11.2 fL (ref 7.5–12.5)
Monocytes Relative: 7 %
NEUTROS ABS: 2992 {cells}/uL (ref 1500–7800)
Neutrophils Relative %: 44 %
PLATELETS: 212 10*3/uL (ref 140–400)
RBC: 5.26 MIL/uL (ref 4.20–5.80)
RDW: 14 % (ref 11.0–15.0)
WBC: 6.8 10*3/uL (ref 4.0–10.5)

## 2015-09-17 LAB — HEMOGLOBIN A1C
Hgb A1c MFr Bld: 5.7 % — ABNORMAL HIGH (ref <5.7)
MEAN PLASMA GLUCOSE: 117 mg/dL

## 2015-09-17 LAB — LIPID PANEL
CHOL/HDL RATIO: 5.4 ratio — AB (ref <=5.0)
CHOLESTEROL: 156 mg/dL (ref 125–200)
HDL: 29 mg/dL — ABNORMAL LOW (ref >=40)
LDL Cholesterol: 99 mg/dL (ref <130)
Triglycerides: 142 mg/dL (ref <150)
VLDL: 28 mg/dL (ref <30)

## 2015-09-17 NOTE — Progress Notes (Signed)
Subjective:   HPI  Mark Valentine is a 60 y.o. male who presents for a complete physical.   Medical care team includes:  Dr. Philipp Ovens, dentist  Eye doctor  Urology  Dorothea Ogle, PA-C along with Dr. Redmond School here for primary care   Prior vaccinations: TD or Tdap: <8 years ago Influenza: yes gets every year Pneumococcal: n/a Shingles/Zostavax:n/a  Concerns: Since last visit had robotic prostatectomy in 06-25-15, still dealing with urinary incontinence issues   Main concern is his hip arthritis.  Has surgery on each hip planned the next 2 months, 1 per month  He notes white coat hypertension.  Checks BPs at home.   Gets 130/70-80s most of the time.  If in pain, gets a little higher.  Last year he was taking care of his mother, but she passed away.  However, he is still driving [phone] miles daily for work.   Reviewed their medical, surgical, family, social, medication, and allergy history and updated chart as appropriate.  Past Medical History  Diagnosis Date  . Osteoarthritis of hip   . Hypertension   . Lipoprotein deficiencies   . Anal fissure 1980  . Low HDL (under 40)   . Wears glasses   . Hip pain, bilateral     pending surgery 09/2015, [facility]  . History of testicular cancer   . Prostate cancer [facility]) 25-Jun-2015    robotic prostatectomy    Past Surgical History  Procedure Laterality Date  . Prostate biopsy  06-24-2008    Dr. Amalia Hailey, Urology  . Orchiectomy  1997  . Anal fistula repair  1980's  . Colonoscopy  02/01/11    normal, repeat 2020-06-24; Dr. Sharlett Iles  . Prostatectomy  06/25/2015    robotic    Social History   Social History  . Marital Status: Married    Spouse Name: N/A  . Number of Children: 1  . Years of Education: N/A   Occupational History  . Not on file.   Social History Main Topics  . Smoking status: Never Smoker   . Smokeless tobacco: Never Used  . Alcohol Use: No  . Drug Use: No  . Sexual Activity: Not on file   Other Topics Concern   . Not on file   Social History Narrative   Married, limited exercise, former judge.  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  First wife passed away 06-25-1999.  Has 1 daugthter.  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  Commutes to [place] daily. As of 7/ 2017    Family History  Problem Relation Age of Onset  . Arthritis Mother   . Diabetes Mother   . Stroke Mother   . Hypertension Mother   . Heart disease Father     aortic disease, possible dissection  . Hypertension Sister   . Mental illness Sister   . Diabetes Sister   . Hypertension Brother      Current outpatient prescriptions:  .  aspirin 325 MG tablet, Take 325 mg by mouth daily. Reported on 09/17/2015, Disp: , Rfl:  .  cetirizine (ZYRTEC) 10 MG tablet, Take 10 mg by mouth daily. Reported on 09/17/2015, Disp: , Rfl:  .  traMADol (ULTRAM) 50 MG tablet, Take 1 tablet (50 mg total) by mouth every 4 (four) hours as needed. (Patient not taking: Reported on 09/17/2015), Disp: 30 tablet, Rfl: 0  Allergies  Allergen Reactions  . Mobic [Meloxicam] Other (See Comments)    bleeding  Review of Systems Constitutional: -fever, -chills, -sweats, -unexpected weight change, -decreased appetite, -fatigue Allergy: -sneezing, -itching, -congestion Dermatology: -changing moles, --rash, -lumps ENT: -runny nose, -ear pain, -sore throat, -hoarseness, -sinus pain, -teeth pain, - ringing in ears, -hearing loss, -nosebleeds Cardiology: -chest pain, -palpitations, -swelling, -difficulty breathing when lying flat, -waking up short of breath Respiratory: -cough, -shortness of breath, -difficulty breathing with exercise or exertion, -wheezing, -coughing up blood Gastroenterology: -abdominal pain, -nausea, -vomiting, -diarrhea, -constipation, -blood in stool, -changes in bowel movement, -difficulty swallowing or eating Hematology: -bleeding, -bruising  Musculoskeletal: +joint aches, -muscle aches, -joint swelling, -back pain, -neck  pain, -cramping, -changes in gait Ophthalmology: denies vision changes, eye redness, itching, discharge Urology: -burning with urination, -difficulty urinating, -blood in urine, -urinary frequency, -urgency, -incontinence Neurology: -headache, -weakness, -tingling, -numbness, -memory loss, -falls, -dizziness Psychology: -depressed mood, -agitation, -sleep problems     Objective:   Physical Exam  BP 140/88 mmHg  Pulse 75  Wt 238 lb (107.956 kg)  General appearance: alert, no distress, WD/WN, AA male Skin: right upper back with medium to large pedunculated skin tag, several small skin tags along neck anterior and bilat, no worrisome lesions HEENT: normocephalic, conjunctiva/corneas normal, sclerae anicteric, PERRLA, EOMi, nares patent, no discharge or erythema, pharynx normal Oral cavity: MMM, tongue normal, teeth in good repair Neck: supple, no lymphadenopathy, no thyromegaly, no masses, normal ROM, no bruits Chest: non tender, normal shape and expansion Heart: RRR, normal S1, S2, no murmurs Lungs: CTA bilaterally, no wheezes, rhonchi, or rales Abdomen: +bs, soft, non tender, non distended, no masses, no hepatomegaly, no splenomegaly, no bruits Back: non tender, normal ROM, no scoliosis Musculoskeletal: limited bilat hip ROM, pain with hip ROM bilat, otherwise upper extremities non tender, no obvious deformity, normal ROM throughout, lower extremities non tender, no obvious deformity, normal ROM throughout Extremities: no edema, no cyanosis, no clubbing Pulses: 2+ symmetric, upper and lower extremities, normal cap refill Neurological: alert, oriented x 3, CN2-12 intact, strength normal upper extremities and lower extremities, sensation normal throughout, DTRs 2+ throughout, no cerebellar signs, gait normal Psychiatric: normal affect, behavior normal, pleasant  GU: normal male external genitalia, uncircumcised, only 1 testes present on left, s/p orchiectomy, right upper groin surgical  scar, otherwise nontender, no masses, no hernia, no lymphadenopathy Rectal: deferred   Adult ECG Report  Indication: elevated BP, preop  Rate: 65 bpm  Rhythm: normal sinus rhythm  QRS Axis: 12 degrees  PR Interval: 114ms  QRS Duration: 91ms  QTc: 353ms  Conduction Disturbances: nonspecific T wave abnormality  Other Abnormalities: none  Patient's cardiac risk factors are: advanced age (older than 36 for men, 53 for women), male gender and obesity (BMI >= 30 kg/m2).  EKG comparison: 2012 EKG  Narrative Interpretation: no acute change    Assessment and Plan :    Encounter Diagnoses  Name Primary?  . Encounter for health maintenance examination in adult Yes  . Essential hypertension   . Obesity   . Mixed dyslipidemia   . Impaired fasting blood sugar   . Preop examination   . Vaccine counseling   . Prostate cancer [facility])    Physical exam - discussed healthy lifestyle, diet, exercise, preventative care, vaccinations, and addressed their concerns.   Advised they see a dentist yearly for routine dental care including hygiene visits twice yearly. Advised they see an eye doctor yearly for routine vision care. Routine screening labs today  Vaccinations: Discussed appropriate vaccines including Tdap, Influenza.  He declines vaccines today.  Other concerns today: Obesity -  work on lifestyle changes and weight loss  Hypertension - advised he monitor BPs outside of here.  He reports consistent 130/80s, and reports white coat hypertension when he comes to the doctor.  Avoid salt, work on weight loss and healthy diet and exercise.  discussed hand bike given his hip issues.  Mixed dyslipidemia - he has >10% cardiovascular disease 10 year risk.  discussed recommendations for Aspirin and Statin daily.  He will consider  preop exam - pending labs, will send notice to [place] since he has upcoming surgery planned.    prostate cancer - reviewed urology notes, he is s/p robotic  prostatectomy  Follow up pending labs  Minor was seen today for annual exam.  Diagnoses and all orders for this visit:  Encounter for health maintenance examination in adult -     Comprehensive metabolic panel -     Lipid panel -     CBC with Differential/Platelet -     Hemoglobin A1c -     Microalbumin / creatinine urine ratio -     EKG 12-Lead  Essential hypertension -     Comprehensive metabolic panel -     Lipid panel -     CBC with Differential/Platelet -     Hemoglobin A1c -     Microalbumin / creatinine urine ratio -     EKG 12-Lead  Obesity -     Comprehensive metabolic panel -     Lipid panel -     CBC with Differential/Platelet -     Hemoglobin A1c -     Microalbumin / creatinine urine ratio -     EKG 12-Lead  Mixed dyslipidemia -     Comprehensive metabolic panel -     Lipid panel -     CBC with Differential/Platelet -     Hemoglobin A1c -     Microalbumin / creatinine urine ratio -     EKG 12-Lead  Impaired fasting blood sugar -     Comprehensive metabolic panel -     Lipid panel -     CBC with Differential/Platelet -     Hemoglobin A1c -     Microalbumin / creatinine urine ratio -     EKG 12-Lead  Preop examination -     Comprehensive metabolic panel -     Lipid panel -     CBC with Differential/Platelet -     Hemoglobin A1c -     Microalbumin / creatinine urine ratio -     EKG 12-Lead  Vaccine counseling  Prostate cancer ([place])

## 2015-09-18 ENCOUNTER — Encounter: Payer: Self-pay | Admitting: Medical

## 2015-09-18 LAB — MICROALBUMIN / CREATININE URINE RATIO
CREATININE, URINE: 133 mg/dL (ref 20–370)
MICROALB UR: 0.2 mg/dL
Microalb Creat Ratio: 2 mcg/mg creat (ref <30)

## 2015-09-18 MED ORDER — PRAVASTATIN SODIUM 20 MG PO TABS: 20.0000 mg | ORAL_TABLET | Freq: Every day | ORAL | Status: DC

## 2015-09-18 MED ORDER — ASPIRIN EC 81 MG PO TBEC: 81.0000 mg | DELAYED_RELEASE_TABLET | Freq: Every day | ORAL | Status: DC

## 2015-09-24 ENCOUNTER — Encounter (HOSPITAL_COMMUNITY)
Admission: RE | Admit: 2015-09-24 | Discharge: 2015-09-24 | Disposition: A | Discharge status: Home / Self Care | Payer: BC Managed Care – PPO | Source: Ambulatory Visit | Attending: Orthopedic Surgery | Admitting: Orthopedic Surgery

## 2015-09-24 ENCOUNTER — Encounter (HOSPITAL_COMMUNITY): Payer: Self-pay

## 2015-09-24 DIAGNOSIS — I10 Essential (primary) hypertension: Secondary | ICD-10-CM

## 2015-09-24 DIAGNOSIS — Z0183 Encounter for blood typing: Secondary | ICD-10-CM | POA: Insufficient documentation

## 2015-09-24 DIAGNOSIS — Z01812 Encounter for preprocedural laboratory examination: Secondary | ICD-10-CM | POA: Diagnosis present

## 2015-09-24 DIAGNOSIS — M1611 Unilateral primary osteoarthritis, right hip: Secondary | ICD-10-CM | POA: Insufficient documentation

## 2015-09-24 HISTORY — DX: Essential (primary) hypertension: I10

## 2015-09-24 LAB — SURGICAL PCR SCREEN
MRSA, PCR: NEGATIVE
STAPHYLOCOCCUS AUREUS: NEGATIVE

## 2015-09-24 LAB — ABO/RH: ABO/RH(D): B POS

## 2015-09-24 NOTE — Patient Instructions (Signed)
Mark Valentine  09/24/2015   Your procedure is scheduled on: 09/30/15  Report to [facility] Main  Entrance take [facility]  elevators to 3rd floor to  [place] at 11:15 AM.  Call this number if you have problems the morning of surgery [phone]   Remember: ONLY 1 PERSON MAY GO WITH YOU TO SHORT STAY TO GET  READY MORNING OF [place].  Do not eat food :After Midnight Monday               CLEAR LIQUID DIET   Foods Allowed                                                                     Foods Excluded  Coffee and tea, regular and decaf                             liquids that you cannot  Plain Jell-O in any flavor                                             see through such as: Fruit ices (not with fruit pulp)                                     milk, soups, orange juice  Iced Popsicles                                    All solid food Carbonated beverages, regular and diet                                    Cranberry, grape and apple juices Sports drinks like Gatorade Lightly seasoned clear broth or consume(fat free) Sugar, honey syrup  Sample Menu Breakfast                                Lunch                                     Supper Cranberry juice                    Beef broth                            Chicken broth Jell-O                                     Grape juice  Apple juice Coffee or tea                        Jell-O                                      Popsicle                                                Coffee or tea                        Coffee or tea  _____________________________________________________________________  Clear liquids ok until 8am on Tuesday     Take these medicines the morning of surgery with A SIP OF WATER:  DO NOT TAKE ANY DIABETIC MEDICATIONS DAY OF YOUR SURGERY                               You may not have any metal on your body including hair pins and   piercings  Do not wear jewelry, make-up, lotions, powders or perfumes, deodorant             Do not wear nail polish.  Do not shave  48 hours prior to surgery.              Men may shave face and neck.   Do not bring valuables to the hospital. [place].  Contacts, dentures or bridgework may not be worn into surgery.  Leave suitcase in the car. After surgery it may be brought to your room.     Patients discharged the day of surgery will not be allowed to drive home.  Name and phone number of your driver:  Special Instructions: N/A              Please read over the following fact sheets you were given: _____________________________________________________________________               Incentive Spirometer  An incentive spirometer is a tool that can help keep your lungs clear and active. This tool measures how well you are filling your lungs with each breath. Taking long deep breaths may help reverse or decrease the chance of developing breathing (pulmonary) problems (especially infection) following:  A long period of time when you are unable to move or be active. BEFORE THE PROCEDURE   If the spirometer includes an indicator to show your best effort, your nurse or respiratory therapist will set it to a desired goal.  If possible, sit up straight or lean slightly forward. Try not to slouch.  Hold the incentive spirometer in an upright position. INSTRUCTIONS FOR USE  1. Sit on the edge of your bed if possible, or sit up as far as you can in bed or on a chair. 2. Hold the incentive spirometer in an upright position. 3. Breathe out normally. 4. Place the mouthpiece in your mouth and seal your lips tightly around it. 5. Breathe in slowly and as deeply as possible, raising the piston or the ball toward the top of the column. 6. Hold  your breath for 3-5 seconds or for as long as possible. Allow the piston or ball to fall to the bottom  of the column. 7. Remove the mouthpiece from your mouth and breathe out normally. 8. Rest for a few seconds and repeat Steps 1 through 7 at least 10 times every 1-2 hours when you are awake. Take your time and take a few normal breaths between deep breaths. 9. The spirometer may include an indicator to show your best effort. Use the indicator as a goal to work toward during each repetition. 10. After each set of 10 deep breaths, practice coughing to be sure your lungs are clear. If you have an incision (the cut made at the time of surgery), support your incision when coughing by placing a pillow or rolled up towels firmly against it. Once you are able to get out of bed, walk around indoors and cough well. You may stop using the incentive spirometer when instructed by your caregiver.  RISKS AND COMPLICATIONS  Take your time so you do not get dizzy or light-headed.  If you are in pain, you may need to take or ask for pain medication before doing incentive spirometry. It is harder to take a deep breath if you are having pain. AFTER USE  Rest and breathe slowly and easily.  It can be helpful to keep track of a log of your progress. Your caregiver can provide you with a simple table to help with this. If you are using the spirometer at home, follow these instructions: [place] IF:   You are having difficultly using the spirometer.  You have trouble using the spirometer as often as instructed.  Your pain medication is not giving enough relief while using the spirometer.  You develop fever of 100.5 F (38.1 C) or higher. SEEK IMMEDIATE MEDICAL CARE IF:   You cough up bloody sputum that had not been present before.  You develop fever of 102 F (38.9 C) or greater.  You develop worsening pain at or near the incision site. MAKE SURE YOU:   Understand these instructions.  Will watch your condition.  Will get help right away if you are not doing well or get worse. Document  Released: 06/28/2006 Document Revised: 05/10/2011 Document Reviewed: 08/29/2006 ExitCare Patient Information 2014 [employer].   ________________________________________________________________________ [facility] - Preparing for Surgery Before surgery, you can play an important role.  Because skin is not sterile, your skin needs to be as free of germs as possible.  You can reduce the number of germs on your skin by washing with CHG (chlorahexidine gluconate) soap before surgery.  CHG is an antiseptic cleaner which kills germs and bonds with the skin to continue killing germs even after washing. Please DO NOT use if you have an allergy to CHG or antibacterial soaps.  If your skin becomes reddened/irritated stop using the CHG and inform your nurse when you arrive at Short Stay. Do not shave (including legs and underarms) for at least 48 hours prior to the first CHG shower.  You may shave your face/neck. Please follow these instructions carefully:  1.  Shower with CHG Soap the night before surgery and the  morning of Surgery.  2.  If you choose to wash your hair, wash your hair first as usual with your  normal  shampoo.  3.  After you shampoo, rinse your hair and body thoroughly to remove the  shampoo.  4.  Use CHG as you would any other liquid soap.  You can apply chg directly  to the skin and wash                       Gently with a scrungie or clean washcloth.  5.  Apply the CHG Soap to your body ONLY FROM THE NECK DOWN.   Do not use on face/ open                           Wound or open sores. Avoid contact with eyes, ears mouth and genitals (private parts).                       Wash face,  Genitals (private parts) with your normal soap.             6.  Wash thoroughly, paying special attention to the area where your surgery  will be performed.  7.  Thoroughly rinse your body with warm water from the neck down.  8.  DO NOT shower/wash with your normal soap after  using and rinsing off  the CHG Soap.                9.  Pat yourself dry with a clean towel.            10.  Wear clean pajamas.            11.  Place clean sheets on your bed the night of your first shower and do not  sleep with pets. Day of Surgery : Do not apply any lotions/deodorants the morning of surgery.  Please wear clean clothes to the hospital/surgery center.  FAILURE TO FOLLOW THESE INSTRUCTIONS MAY RESULT IN THE CANCELLATION OF YOUR SURGERY PATIENT SIGNATURE_________________________________  NURSE SIGNATURE__________________________________  ________________________________________________________________________

## 2015-09-24 NOTE — Pre-Procedure Instructions (Signed)
CBC, CMP done 09/17/15 on chart

## 2015-09-30 ENCOUNTER — Inpatient Hospital Stay (HOSPITAL_COMMUNITY): Payer: BC Managed Care – PPO

## 2015-09-30 ENCOUNTER — Encounter (HOSPITAL_COMMUNITY)
Admission: RE | Disposition: A | Discharge status: Home Health | Payer: Self-pay | Source: Ambulatory Visit | Attending: Orthopedic Surgery

## 2015-09-30 ENCOUNTER — Encounter (HOSPITAL_COMMUNITY): Payer: Self-pay | Admitting: *Deleted

## 2015-09-30 ENCOUNTER — Inpatient Hospital Stay (HOSPITAL_COMMUNITY): Payer: BC Managed Care – PPO | Admitting: Anesthesiology

## 2015-09-30 ENCOUNTER — Inpatient Hospital Stay (HOSPITAL_COMMUNITY)
Admission: RE | Admit: 2015-09-30 | Discharge: 2015-10-02 | DRG: 470 | Disposition: A | Discharge status: Home Health | Payer: BC Managed Care – PPO | Source: Ambulatory Visit | Attending: Orthopedic Surgery | Admitting: Orthopedic Surgery

## 2015-09-30 DIAGNOSIS — Z8547 Personal history of malignant neoplasm of testis: Secondary | ICD-10-CM

## 2015-09-30 DIAGNOSIS — M25751 Osteophyte, right hip: Secondary | ICD-10-CM | POA: Diagnosis present

## 2015-09-30 DIAGNOSIS — E669 Obesity, unspecified: Secondary | ICD-10-CM | POA: Diagnosis present

## 2015-09-30 DIAGNOSIS — Z8261 Family history of arthritis: Secondary | ICD-10-CM | POA: Diagnosis not present

## 2015-09-30 DIAGNOSIS — Z6834 Body mass index (BMI) 34.0-34.9, adult: Secondary | ICD-10-CM | POA: Diagnosis not present

## 2015-09-30 DIAGNOSIS — Z96649 Presence of unspecified artificial hip joint: Secondary | ICD-10-CM

## 2015-09-30 DIAGNOSIS — M25551 Pain in right hip: Secondary | ICD-10-CM | POA: Diagnosis present

## 2015-09-30 DIAGNOSIS — M1611 Unilateral primary osteoarthritis, right hip: Secondary | ICD-10-CM | POA: Diagnosis present

## 2015-09-30 DIAGNOSIS — I10 Essential (primary) hypertension: Secondary | ICD-10-CM | POA: Diagnosis present

## 2015-09-30 HISTORY — PX: TOTAL HIP ARTHROPLASTY: SHX124

## 2015-09-30 LAB — TYPE AND SCREEN
ABO/RH(D): B POS
Antibody Screen: NEGATIVE

## 2015-09-30 SURGERY — ARTHROPLASTY, HIP, TOTAL, ANTERIOR APPROACH
Anesthesia: General | Site: Hip | Laterality: Right

## 2015-09-30 MED ORDER — FENTANYL CITRATE (PF) 100 MCG/2ML IJ SOLN
INTRAMUSCULAR | Status: AC
  Filled 2015-09-30: qty 2

## 2015-09-30 MED ORDER — ONDANSETRON HCL 4 MG PO TABS
4.0000 mg | ORAL_TABLET | Freq: Four times a day (QID) | ORAL | Status: DC | PRN
  Administered 2015-10-01: 4 mg via ORAL
  Filled 2015-09-30: qty 1

## 2015-09-30 MED ORDER — CEFAZOLIN SODIUM-DEXTROSE 2-4 GM/100ML-% IV SOLN
INTRAVENOUS | Status: AC
  Filled 2015-09-30: qty 100

## 2015-09-30 MED ORDER — ONDANSETRON HCL 4 MG/2ML IJ SOLN
INTRAMUSCULAR | Status: AC
  Filled 2015-09-30: qty 2

## 2015-09-30 MED ORDER — MIDAZOLAM HCL 2 MG/2ML IJ SOLN
INTRAMUSCULAR | Status: AC
  Filled 2015-09-30: qty 2

## 2015-09-30 MED ORDER — ONDANSETRON HCL 4 MG/2ML IJ SOLN
INTRAMUSCULAR | Status: DC | PRN
  Administered 2015-09-30: 4 mg via INTRAVENOUS

## 2015-09-30 MED ORDER — SUGAMMADEX SODIUM 500 MG/5ML IV SOLN
INTRAVENOUS | Status: AC
  Filled 2015-09-30: qty 5

## 2015-09-30 MED ORDER — MEPERIDINE HCL 50 MG/ML IJ SOLN: 6.2500 mg | INTRAMUSCULAR | Status: DC | PRN

## 2015-09-30 MED ORDER — 0.9 % SODIUM CHLORIDE (POUR BTL) OPTIME
TOPICAL | Status: DC | PRN
  Administered 2015-09-30: 1000 mL

## 2015-09-30 MED ORDER — SODIUM CHLORIDE 0.9 % IJ SOLN
INTRAMUSCULAR | Status: AC
  Filled 2015-09-30: qty 20

## 2015-09-30 MED ORDER — DIPHENHYDRAMINE HCL 25 MG PO CAPS: 25.0000 mg | ORAL_CAPSULE | Freq: Four times a day (QID) | ORAL | Status: DC | PRN

## 2015-09-30 MED ORDER — HYDROMORPHONE HCL 1 MG/ML IJ SOLN
INTRAMUSCULAR | Status: AC
  Filled 2015-09-30: qty 1

## 2015-09-30 MED ORDER — ALUM & MAG HYDROXIDE-SIMETH 200-200-20 MG/5ML PO SUSP: 30.0000 mL | ORAL | Status: DC | PRN

## 2015-09-30 MED ORDER — METOCLOPRAMIDE HCL 5 MG/ML IJ SOLN: 5.0000 mg | Freq: Three times a day (TID) | INTRAMUSCULAR | Status: DC | PRN

## 2015-09-30 MED ORDER — MAGNESIUM CITRATE PO SOLN: 1.0000 | Freq: Once | ORAL | Status: DC | PRN

## 2015-09-30 MED ORDER — DEXTROSE 5 % IV SOLN
500.0000 mg | Freq: Four times a day (QID) | INTRAVENOUS | Status: DC | PRN
  Administered 2015-09-30: 500 mg via INTRAVENOUS
  Filled 2015-09-30: qty 550
  Filled 2015-09-30: qty 5

## 2015-09-30 MED ORDER — METOCLOPRAMIDE HCL 5 MG/ML IJ SOLN: 10.0000 mg | Freq: Once | INTRAMUSCULAR | Status: DC | PRN

## 2015-09-30 MED ORDER — METHOCARBAMOL 500 MG PO TABS
500.0000 mg | ORAL_TABLET | Freq: Four times a day (QID) | ORAL | Status: DC | PRN
  Administered 2015-10-01 (×2): 500 mg via ORAL
  Filled 2015-09-30 (×2): qty 1

## 2015-09-30 MED ORDER — DOCUSATE SODIUM 100 MG PO CAPS
100.0000 mg | ORAL_CAPSULE | Freq: Two times a day (BID) | ORAL | Status: DC
  Administered 2015-09-30 – 2015-10-02 (×4): 100 mg via ORAL
  Filled 2015-09-30 (×4): qty 1

## 2015-09-30 MED ORDER — BISACODYL 10 MG RE SUPP: 10.0000 mg | Freq: Every day | RECTAL | Status: DC | PRN

## 2015-09-30 MED ORDER — SUGAMMADEX SODIUM 200 MG/2ML IV SOLN
INTRAVENOUS | Status: DC | PRN
  Administered 2015-09-30: 250 mg via INTRAVENOUS

## 2015-09-30 MED ORDER — DEXAMETHASONE SODIUM PHOSPHATE 10 MG/ML IJ SOLN
10.0000 mg | Freq: Once | INTRAMUSCULAR | Status: AC
  Administered 2015-10-01: 10 mg via INTRAVENOUS
  Filled 2015-09-30: qty 1

## 2015-09-30 MED ORDER — SUFENTANIL CITRATE 50 MCG/ML IV SOLN
INTRAVENOUS | Status: AC
  Filled 2015-09-30: qty 1

## 2015-09-30 MED ORDER — TRANEXAMIC ACID 1000 MG/10ML IV SOLN
1000.0000 mg | INTRAVENOUS | Status: AC
  Administered 2015-09-30: 1000 mg via INTRAVENOUS
  Filled 2015-09-30: qty 10

## 2015-09-30 MED ORDER — LORATADINE 10 MG PO TABS
10.0000 mg | ORAL_TABLET | Freq: Every day | ORAL | Status: DC
  Administered 2015-10-01 – 2015-10-02 (×2): 10 mg via ORAL
  Filled 2015-09-30 (×3): qty 1

## 2015-09-30 MED ORDER — ASPIRIN EC 325 MG PO TBEC
325.0000 mg | DELAYED_RELEASE_TABLET | Freq: Two times a day (BID) | ORAL | Status: DC
  Administered 2015-10-01 – 2015-10-02 (×3): 325 mg via ORAL
  Filled 2015-09-30 (×3): qty 1

## 2015-09-30 MED ORDER — CHLORHEXIDINE GLUCONATE 4 % EX LIQD: 60.0000 mL | Freq: Once | CUTANEOUS | Status: DC

## 2015-09-30 MED ORDER — FENTANYL CITRATE (PF) 100 MCG/2ML IJ SOLN
25.0000 ug | INTRAMUSCULAR | Status: DC | PRN
  Administered 2015-09-30 (×3): 50 ug via INTRAVENOUS

## 2015-09-30 MED ORDER — EPHEDRINE SULFATE 50 MG/ML IJ SOLN
INTRAMUSCULAR | Status: DC | PRN
  Administered 2015-09-30: 15 mg via INTRAVENOUS
  Administered 2015-09-30: 10 mg via INTRAVENOUS

## 2015-09-30 MED ORDER — ROCURONIUM BROMIDE 100 MG/10ML IV SOLN
INTRAVENOUS | Status: DC | PRN
  Administered 2015-09-30: 50 mg via INTRAVENOUS
  Administered 2015-09-30: 30 mg via INTRAVENOUS
  Administered 2015-09-30: 10 mg via INTRAVENOUS

## 2015-09-30 MED ORDER — HYDROCODONE-ACETAMINOPHEN 7.5-325 MG PO TABS
1.0000 | ORAL_TABLET | ORAL | Status: DC
  Administered 2015-09-30: 1 via ORAL
  Administered 2015-09-30: 2 via ORAL
  Administered 2015-09-30: 1 via ORAL
  Administered 2015-10-01 – 2015-10-02 (×8): 2 via ORAL
  Filled 2015-09-30 (×8): qty 2
  Filled 2015-09-30: qty 1
  Filled 2015-09-30: qty 2
  Filled 2015-09-30: qty 1
  Filled 2015-09-30: qty 2

## 2015-09-30 MED ORDER — POLYETHYLENE GLYCOL 3350 17 G PO PACK
17.0000 g | PACK | Freq: Two times a day (BID) | ORAL | Status: DC
  Administered 2015-10-01 – 2015-10-02 (×2): 17 g via ORAL
  Filled 2015-09-30 (×3): qty 1

## 2015-09-30 MED ORDER — TRANEXAMIC ACID 1000 MG/10ML IV SOLN
1000.0000 mg | Freq: Once | INTRAVENOUS | Status: AC
  Administered 2015-09-30: 1000 mg via INTRAVENOUS
  Filled 2015-09-30: qty 10

## 2015-09-30 MED ORDER — FENTANYL CITRATE (PF) 100 MCG/2ML IJ SOLN
INTRAMUSCULAR | Status: DC | PRN
  Administered 2015-09-30 (×2): 50 ug via INTRAVENOUS

## 2015-09-30 MED ORDER — PROPOFOL 10 MG/ML IV BOLUS
INTRAVENOUS | Status: DC | PRN
  Administered 2015-09-30: 200 mg via INTRAVENOUS

## 2015-09-30 MED ORDER — PHENOL 1.4 % MT LIQD
1.0000 | OROMUCOSAL | Status: DC | PRN
  Filled 2015-09-30: qty 177

## 2015-09-30 MED ORDER — ROCURONIUM BROMIDE 100 MG/10ML IV SOLN
INTRAVENOUS | Status: AC
  Filled 2015-09-30: qty 1

## 2015-09-30 MED ORDER — HYDROMORPHONE HCL 1 MG/ML IJ SOLN
0.5000 mg | INTRAMUSCULAR | Status: DC | PRN
  Administered 2015-09-30 – 2015-10-01 (×2): 1 mg via INTRAVENOUS
  Filled 2015-09-30: qty 1

## 2015-09-30 MED ORDER — SODIUM CHLORIDE 0.9 % IJ SOLN
INTRAMUSCULAR | Status: AC
Start: 2015-09-30 — End: 2015-09-30
  Filled 2015-09-30: qty 10

## 2015-09-30 MED ORDER — FERROUS SULFATE 325 (65 FE) MG PO TABS
325.0000 mg | ORAL_TABLET | Freq: Three times a day (TID) | ORAL | Status: DC
  Administered 2015-10-01 – 2015-10-02 (×4): 325 mg via ORAL
  Filled 2015-09-30 (×4): qty 1

## 2015-09-30 MED ORDER — PROPOFOL 10 MG/ML IV BOLUS
INTRAVENOUS | Status: AC
  Filled 2015-09-30: qty 60

## 2015-09-30 MED ORDER — ONDANSETRON HCL 4 MG/2ML IJ SOLN
4.0000 mg | Freq: Four times a day (QID) | INTRAMUSCULAR | Status: DC | PRN
  Administered 2015-09-30: 4 mg via INTRAVENOUS
  Filled 2015-09-30: qty 2

## 2015-09-30 MED ORDER — SODIUM CHLORIDE 0.9 % IV SOLN
100.0000 mL/h | INTRAVENOUS | Status: DC
  Administered 2015-09-30: 100 mL/h via INTRAVENOUS
  Filled 2015-09-30 (×3): qty 1000

## 2015-09-30 MED ORDER — MENTHOL 3 MG MT LOZG: 1.0000 | LOZENGE | OROMUCOSAL | Status: DC | PRN

## 2015-09-30 MED ORDER — SUCCINYLCHOLINE CHLORIDE 20 MG/ML IJ SOLN
INTRAMUSCULAR | Status: DC | PRN
  Administered 2015-09-30: 100 mg via INTRAVENOUS

## 2015-09-30 MED ORDER — CEFAZOLIN SODIUM-DEXTROSE 2-4 GM/100ML-% IV SOLN
2.0000 g | Freq: Four times a day (QID) | INTRAVENOUS | Status: AC
  Administered 2015-09-30 – 2015-10-01 (×2): 2 g via INTRAVENOUS
  Filled 2015-09-30 (×2): qty 100

## 2015-09-30 MED ORDER — DEXAMETHASONE SODIUM PHOSPHATE 10 MG/ML IJ SOLN
INTRAMUSCULAR | Status: AC
  Filled 2015-09-30: qty 1

## 2015-09-30 MED ORDER — METOCLOPRAMIDE HCL 5 MG PO TABS: 5.0000 mg | ORAL_TABLET | Freq: Three times a day (TID) | ORAL | Status: DC | PRN

## 2015-09-30 MED ORDER — LACTATED RINGERS IV SOLN: INTRAVENOUS | Status: DC

## 2015-09-30 MED ORDER — HYDROMORPHONE HCL 1 MG/ML IJ SOLN
0.5000 mg | Freq: Once | INTRAMUSCULAR | Status: AC
  Administered 2015-09-30: 0.5 mg via INTRAVENOUS

## 2015-09-30 MED ORDER — LACTATED RINGERS IV SOLN
INTRAVENOUS | Status: DC
  Administered 2015-09-30: 16:00:00 via INTRAVENOUS
  Administered 2015-09-30: 1000 mL via INTRAVENOUS
  Administered 2015-09-30: 15:00:00 via INTRAVENOUS

## 2015-09-30 MED ORDER — SUFENTANIL CITRATE 50 MCG/ML IV SOLN
INTRAVENOUS | Status: DC | PRN
  Administered 2015-09-30: 20 ug via INTRAVENOUS
  Administered 2015-09-30 (×3): 10 ug via INTRAVENOUS

## 2015-09-30 MED ORDER — DEXAMETHASONE SODIUM PHOSPHATE 10 MG/ML IJ SOLN
10.0000 mg | Freq: Once | INTRAMUSCULAR | Status: AC
  Administered 2015-09-30: 10 mg via INTRAVENOUS

## 2015-09-30 MED ORDER — CEFAZOLIN SODIUM-DEXTROSE 2-4 GM/100ML-% IV SOLN
2.0000 g | INTRAVENOUS | Status: AC
  Administered 2015-09-30: 2 g via INTRAVENOUS

## 2015-09-30 SURGICAL SUPPLY — 36 items
BAG DECANTER FOR FLEXI CONT (MISCELLANEOUS) IMPLANT
BAG SPEC THK2 15X12 ZIP CLS (MISCELLANEOUS)
BAG ZIPLOCK 12X15 (MISCELLANEOUS) IMPLANT
CAPT HIP TOTAL 2 ×1 IMPLANT
CLOTH BEACON ORANGE TIMEOUT ST (SAFETY) ×2 IMPLANT
COVER PERINEAL POST (MISCELLANEOUS) ×2 IMPLANT
DRAPE STERI IOBAN 125X83 (DRAPES) ×2 IMPLANT
DRAPE U-SHAPE 47X51 STRL (DRAPES) ×4 IMPLANT
DRESSING AQUACEL AG SP 3.5X10 (GAUZE/BANDAGES/DRESSINGS) ×1 IMPLANT
DRSG AQUACEL AG SP 3.5X10 (GAUZE/BANDAGES/DRESSINGS) ×2
DURAPREP 26ML APPLICATOR (WOUND CARE) ×2 IMPLANT
ELECT REM PT RETURN 15FT ADLT (MISCELLANEOUS) IMPLANT
ELECT REM PT RETURN 9FT ADLT (ELECTROSURGICAL) ×2
ELECTRODE REM PT RTRN 9FT ADLT (ELECTROSURGICAL) ×1 IMPLANT
GLOVE BIOGEL M STRL SZ7.5 (GLOVE) IMPLANT
GLOVE BIOGEL PI IND STRL 7.5 (GLOVE) ×1 IMPLANT
GLOVE BIOGEL PI IND STRL 8.5 (GLOVE) ×1 IMPLANT
GLOVE BIOGEL PI INDICATOR 7.5 (GLOVE) ×1
GLOVE BIOGEL PI INDICATOR 8.5 (GLOVE) ×1
GLOVE ECLIPSE 8.0 STRL XLNG CF (GLOVE) ×4 IMPLANT
GLOVE ORTHO TXT STRL SZ7.5 (GLOVE) ×2 IMPLANT
GOWN STRL REUS W/TWL LRG LVL3 (GOWN DISPOSABLE) ×2 IMPLANT
GOWN STRL REUS W/TWL XL LVL3 (GOWN DISPOSABLE) ×2 IMPLANT
HOLDER FOLEY CATH W/STRAP (MISCELLANEOUS) ×2 IMPLANT
LIQUID BAND (GAUZE/BANDAGES/DRESSINGS) ×2 IMPLANT
PACK ANTERIOR HIP CUSTOM (KITS) ×2 IMPLANT
SAW OSC TIP CART 19.5X105X1.3 (SAW) ×2 IMPLANT
SUT MNCRL AB 4-0 PS2 18 (SUTURE) ×2 IMPLANT
SUT VIC AB 1 CT1 36 (SUTURE) ×6 IMPLANT
SUT VIC AB 2-0 CT1 27 (SUTURE) ×4
SUT VIC AB 2-0 CT1 TAPERPNT 27 (SUTURE) ×2 IMPLANT
SUT VLOC 180 0 24IN GS25 (SUTURE) ×2 IMPLANT
TRAY FOLEY W/METER SILVER 14FR (SET/KITS/TRAYS/PACK) IMPLANT
TRAY FOLEY W/METER SILVER 16FR (SET/KITS/TRAYS/PACK) ×1 IMPLANT
WATER STERILE IRR 1500ML POUR (IV SOLUTION) ×2 IMPLANT
YANKAUER SUCT BULB TIP 10FT TU (MISCELLANEOUS) ×1 IMPLANT

## 2015-09-30 NOTE — Transfer of Care (Signed)
Immediate Anesthesia Transfer of Care Note  Patient: Mark Valentine  Procedure(s) Performed: Procedure(s): RIGHT TOTAL HIP ARTHROPLASTY ANTERIOR APPROACH (Right)  Patient Location: PACU  Anesthesia Type:General  Level of Consciousness: awake, alert  and oriented  Airway & Oxygen Therapy: Patient Spontanous Breathing and Patient connected to face mask oxygen  Post-op Assessment: Report given to RN and Post -op Vital signs reviewed and stable  Post vital signs: Reviewed and stable  Last Vitals:  Vitals:   09/30/15 1114  BP: (!) 158/99  Pulse: 80  Resp: 18  Temp: 36.7 C    Last Pain:  Vitals:   09/30/15 1319  TempSrc:   PainSc: 4       Patients Stated Pain Goal: 4 ([id number])  Complications: No apparent anesthesia complications

## 2015-09-30 NOTE — Interval H&P Note (Signed)
History and Physical Interval Note:  09/30/2015 1:55 PM  Mark Valentine  has presented today for surgery, with the diagnosis of right hip osteoarthritis  The various methods of treatment have been discussed with the patient and family. After consideration of risks, benefits and other options for treatment, the patient has consented to  Procedure(s): RIGHT TOTAL HIP ARTHROPLASTY ANTERIOR APPROACH (Right) as a surgical intervention .  The patient's history has been reviewed, patient examined, no change in status, stable for surgery.  I have reviewed the patient's chart and labs.  Questions were answered to the patient's satisfaction.     [name]

## 2015-09-30 NOTE — Anesthesia Procedure Notes (Signed)
Procedure Name: Intubation Date/Time: 09/30/2015 2:08 PM Performed by: Danley Danker L Patient Re-evaluated:Patient Re-evaluated prior to inductionOxygen Delivery Method: Circle system utilized Preoxygenation: Pre-oxygenation with 100% oxygen Intubation Type: IV induction Ventilation: Mask ventilation without difficulty and Oral airway inserted - appropriate to patient size Laryngoscope Size: Miller and 3 Grade View: Grade I Tube type: Oral Tube size: 8.0 mm Number of attempts: 1 Airway Equipment and Method: Stylet Placement Confirmation: ETT inserted through vocal cords under direct vision,  positive ETCO2 and breath sounds checked- equal and bilateral Secured at: 22 cm Tube secured with: Tape Dental Injury: Teeth and Oropharynx as per pre-operative assessment

## 2015-09-30 NOTE — Anesthesia Postprocedure Evaluation (Signed)
Anesthesia Post Note  Patient: Mark Valentine  Procedure(s) Performed: Procedure(s) (LRB): RIGHT TOTAL HIP ARTHROPLASTY ANTERIOR APPROACH (Right)  Patient location during evaluation: PACU Anesthesia Type: General Level of consciousness: awake and alert Pain management: pain level controlled Vital Signs Assessment: post-procedure vital signs reviewed and stable Respiratory status: spontaneous breathing, nonlabored ventilation, respiratory function stable and patient connected to nasal cannula oxygen Cardiovascular status: blood pressure returned to baseline and stable Postop Assessment: no signs of nausea or vomiting Anesthetic complications: no    Last Vitals:  Vitals:   09/30/15 1114  BP: (!) 158/99  Pulse: 80  Resp: 18  Temp: 36.7 C    Last Pain:  Vitals:   09/30/15 1319  TempSrc:   PainSc: 4                  Montez Hageman

## 2015-09-30 NOTE — Anesthesia Preprocedure Evaluation (Signed)
Anesthesia Evaluation  Patient identified by MRN, date of birth, ID band Patient awake    Reviewed: Allergy & Precautions, NPO status , Patient's Chart, lab work & pertinent test results  Airway Mallampati: II  TM Distance: >3 FB Neck ROM: Full    Dental no notable dental hx.    Pulmonary neg pulmonary ROS,    Pulmonary exam normal breath sounds clear to auscultation       Cardiovascular negative cardio ROS Normal cardiovascular exam Rhythm:Regular Rate:Normal     Neuro/Psych negative neurological ROS  negative psych ROS   GI/Hepatic negative GI ROS, Neg liver ROS,   Endo/Other  negative endocrine ROS  Renal/GU negative Renal ROS  negative genitourinary   Musculoskeletal negative musculoskeletal ROS (+)   Abdominal   Peds negative pediatric ROS (+)  Hematology negative hematology ROS (+)   Anesthesia Other Findings   Reproductive/Obstetrics negative OB ROS                             Anesthesia Physical Anesthesia Plan  ASA: II  Anesthesia Plan: General   Post-op Pain Management:    Induction: Intravenous  Airway Management Planned: Oral ETT  Additional Equipment:   Intra-op Plan:   Post-operative Plan: Extubation in OR  Informed Consent: I have reviewed the patients History and Physical, chart, labs and discussed the procedure including the risks, benefits and alternatives for the proposed anesthesia with the patient or authorized representative who has indicated his/her understanding and acceptance.   Dental advisory given  Plan Discussed with: CRNA  Anesthesia Plan Comments: (Pt prefers [place])        Anesthesia Quick Evaluation

## 2015-10-01 ENCOUNTER — Encounter (HOSPITAL_COMMUNITY): Payer: Self-pay | Admitting: Orthopedic Surgery

## 2015-10-01 LAB — BASIC METABOLIC PANEL
Anion gap: 6 (ref 5–15)
BUN: 9 mg/dL (ref 6–20)
CALCIUM: 8.3 mg/dL — AB (ref 8.9–10.3)
CHLORIDE: 104 mmol/L (ref 101–111)
CO2: 25 mmol/L (ref 22–32)
Creatinine, Ser: 1.02 mg/dL (ref 0.61–1.24)
GFR calc Af Amer: >60 mL/min (ref >60)
GFR calc non Af Amer: >60 mL/min (ref >60)
Glucose, Bld: 137 mg/dL — ABNORMAL HIGH (ref 65–99)
Potassium: 4.4 mmol/L (ref 3.5–5.1)
SODIUM: 135 mmol/L (ref 135–145)

## 2015-10-01 LAB — CBC
HCT: 39 % (ref 39.0–52.0)
Hemoglobin: 13 g/dL (ref 13.0–17.0)
MCH: 30.6 pg (ref 26.0–34.0)
MCHC: 33.3 g/dL (ref 30.0–36.0)
MCV: 91.8 fL (ref 78.0–100.0)
PLATELETS: 186 10*3/uL (ref 150–400)
RBC: 4.25 MIL/uL (ref 4.22–5.81)
RDW: 13.8 % (ref 11.5–15.5)
WBC: 10.9 10*3/uL — ABNORMAL HIGH (ref 4.0–10.5)

## 2015-10-01 NOTE — Care Management Note (Signed)
Case Management Note  Patient Details  Name: CAMARON CAMMACK MRN: [id number] Date of Birth: April 30, 1955  Subjective/Objective:                  RIGHT TOTAL HIP ARTHROPLASTY ANTERIOR APPROACH (Right)  Action/Plan: Discharge planning Expected Discharge Date: 10/02/15                 Expected Discharge Plan:  [place]  In-House Referral:     Discharge planning Services  CM Consult  Post Acute Care Choice:  Home Health Choice offered to:  Patient  DME Arranged:  N/A DME Agency:  NA  HH Arranged:  PT [place] Agency:  [facility] (now [facility])  Status of Service:  Completed, signed off  If discussed at [employer] of Stay Meetings, dates discussed:    Additional Comments: Cm met with pt to offer choice of home health agency.  Pt chooses [facility] to render HHPT.  Referral given to [employer], Tim.  Pt states he aranged for 3n1 and rolling walker presurgically.  No other CM needs were communicated. Dellie Catholic, RN 10/01/2015, 10:52 AM

## 2015-10-01 NOTE — Progress Notes (Signed)
Physical Therapy Treatment Patient Details Name: JEF HOGUE MRN: [id number] DOB: 01-20-1956 Today's Date: 2015/10/07    History of Present Illness s/p RIGHT TOTAL HIP ARTHROPLASTY ANTERIOR APPROACH (Right)     PT Comments    Pt progressing steadily with mobility and pleased with same.  Follow Up Recommendations  Home health PT     Equipment Recommendations  None recommended by PT    Recommendations for Other Services OT consult     Precautions / Restrictions Precautions Precautions: Fall Restrictions Weight Bearing Restrictions: No Other Position/Activity Restrictions: WBAT    Mobility  Bed Mobility Overal bed mobility: Needs Assistance Bed Mobility: Sit to Supine       Sit to supine: Mod assist   General bed mobility comments: cues for sequence and physical assist to manage Bil LEs into bed  Transfers Overall transfer level: Needs assistance Equipment used: Rolling walker (2 wheeled) Transfers: Sit to/from Stand Sit to Stand: Min assist;From elevated surface         General transfer comment: cues for LE management and use of UEs to self assist  Ambulation/Gait Ambulation/Gait assistance: Min assist Ambulation Distance (Feet): 92 Feet Assistive device: Rolling walker (2 wheeled) Gait Pattern/deviations: Step-to pattern;Decreased step length - right;Decreased step length - left;Shuffle;Trunk flexed Gait velocity: decr Gait velocity interpretation: Below normal speed for age/gender General Gait Details: cues for posture, sequence and position from [employer]            Wheelchair Mobility    Modified Rankin (Stroke Patients Only)       Balance                                    Cognition Arousal/Alertness: Awake/alert Behavior During Therapy: WFL for tasks assessed/performed Overall Cognitive Status: Within Functional Limits for tasks assessed                      Exercises      General Comments         Pertinent Vitals/Pain Pain Assessment: 0-10 Pain Score: 5  Pain Location: R hip Pain Descriptors / Indicators: Aching;Sore Pain Intervention(s): Limited activity within patient's tolerance;Monitored during session;Premedicated before session;Ice applied    Home Living                      Prior Function            PT Goals (current goals can now be found in the care plan section) Acute Rehab PT Goals Patient Stated Goal: regain independence PT Goal Formulation: With patient Time For Goal Achievement: 10/04/15 Potential to Achieve Goals: Good Progress towards PT goals: Progressing toward goals    Frequency  7X/week    PT Plan Current plan remains appropriate    Co-evaluation             End of Session Equipment Utilized During Treatment: Gait belt Activity Tolerance: Patient tolerated treatment well Patient left: in bed;with call bell/phone within reach;with family/visitor present     Time: [id number] PT Time Calculation (min) (ACUTE ONLY): 33 min  Charges:  $Gait Training: 23-37 mins                    G Codes:      Adeola Dennen 10/07/15, 4:04 PM

## 2015-10-01 NOTE — Evaluation (Signed)
Physical Therapy Evaluation Patient Details Name: Mark Valentine MRN: [id number] DOB: 01/18/1956 Today's Date: 10/01/2015   History of Present Illness  s/p RIGHT TOTAL HIP ARTHROPLASTY ANTERIOR APPROACH (Right)   Clinical Impression  Pt s/p R THR presents with decreased R LE strength/ROM, decreased L hip ROM and post op pain limiting functional mobility.  Pt should progress to dc home with family assist and HHPT follow up.    Follow Up Recommendations Home health PT    Equipment Recommendations  None recommended by PT    Recommendations for Other Services OT consult     Precautions / Restrictions Precautions Precautions: Fall Restrictions Weight Bearing Restrictions: No Other Position/Activity Restrictions: WBAT      Mobility  Bed Mobility Overal bed mobility: +2 for physical assistance;Needs Assistance Bed Mobility: Supine to Sit     Supine to sit: Min assist;+2 for physical assistance;+2 for safety/equipment     General bed mobility comments: cues for sequence and use of UEs to self assist.  Transfers Overall transfer level: Needs assistance Equipment used: Rolling walker (2 wheeled) Transfers: Sit to/from Stand Sit to Stand: Min assist;Mod assist;+2 safety/equipment;From elevated surface         General transfer comment: cues for LE management and use of UEs to self assist  Ambulation/Gait Ambulation/Gait assistance: Min assist;+2 safety/equipment Ambulation Distance (Feet): 48 Feet Assistive device: Rolling walker (2 wheeled) Gait Pattern/deviations: Step-to pattern;Decreased step length - right;Decreased step length - left;Shuffle;Trunk flexed Gait velocity: decr Gait velocity interpretation: Below normal speed for age/gender General Gait Details: cues for posture, sequence and position from [employer]            Wheelchair Mobility    Modified Rankin (Stroke Patients Only)       Balance                                              Pertinent Vitals/Pain Pain Assessment: 0-10 Pain Score: 7  Pain Location: R hip Pain Descriptors / Indicators: Aching;Sore Pain Intervention(s): Limited activity within patient's tolerance;Monitored during session;Premedicated before session;Ice applied    Home Living Family/patient expects to be discharged to:: Private residence Living Arrangements: Spouse/significant other Available Help at Discharge: Family Type of Home: House Home Access: Stairs to enter Entrance Stairs-Rails: Right Entrance Stairs-Number of Steps: 3 (vs 1+1 at back door) Home Layout: One level Home Equipment: [employer]ove - 2 wheels;Bedside commode      Prior Function Level of Independence: Independent         Comments: occasional A with LB dressing by wife     Hand Dominance        Extremity/Trunk Assessment   Upper Extremity Assessment: Overall WFL for tasks assessed           Lower Extremity Assessment: RLE deficits/detail;LLE deficits/detail RLE Deficits / Details: Strength at hip 2+/5 with AAROM at hip to 75 flex and 15 abd LLE Deficits / Details: Strength WFL with AROM at hip ltd to 70 flex and 15 abd  Cervical / Trunk Assessment: Normal  Communication   Communication: No difficulties  Cognition Arousal/Alertness: Awake/alert Behavior During Therapy: WFL for tasks assessed/performed Overall Cognitive Status: Within Functional Limits for tasks assessed                      General Comments      Exercises Total Joint  Exercises Ankle Circles/Pumps: AROM;Both;15 reps;Supine Quad Sets: AROM;Both;10 reps;Supine Heel Slides: AAROM;Right;20 reps;Supine Hip ABduction/ADduction: AAROM;Right;15 reps;Supine      Assessment/Plan    PT Assessment Patient needs continued PT services  PT Diagnosis Difficulty walking   PT Problem List Decreased strength;Decreased range of motion;Decreased activity tolerance;Decreased mobility;Decreased knowledge of use of  DME;Pain;Obesity  PT Treatment Interventions DME instruction;Gait training;Stair training;Functional mobility training;Therapeutic activities;Therapeutic exercise;Patient/family education   PT Goals (Current goals can be found in the Care Plan section) Acute Rehab PT Goals Patient Stated Goal: regain independence PT Goal Formulation: With patient Time For Goal Achievement: 10/04/15 Potential to Achieve Goals: Good    Frequency 7X/week   Barriers to discharge        Co-evaluation               End of Session Equipment Utilized During Treatment: Gait belt Activity Tolerance: Patient tolerated treatment well Patient left: in chair;with call bell/phone within reach;with family/visitor present Nurse Communication: Mobility status         Time: [id number] PT Time Calculation (min) (ACUTE ONLY): 39 min   Charges:   PT Evaluation $PT Eval Low Complexity: 1 Procedure PT Treatments $Gait Training: 8-22 mins $Therapeutic Exercise: 8-22 mins   PT G Codes:        Mark Valentine 10-27-15, 12:22 PM

## 2015-10-01 NOTE — Progress Notes (Signed)
Occupational Therapy Evaluation Patient Details Name: Mark Valentine MRN: [id number] DOB: [birth year] Today's Date: 10/01/2015    History of Present Illness s/p RIGHT TOTAL HIP ARTHROPLASTY ANTERIOR APPROACH (Right)    Clinical Impression   Patient presents to OT with decreased ADL independence due to the deficits listed below. He will benefit from skilled OT to maximize function and to facilitate a safe discharge. OT will follow.    Follow Up Recommendations  No OT follow up;Supervision/Assistance - 24 hour    Equipment Recommendations  None recommended by OT    Recommendations for Other Services       Precautions / Restrictions Precautions Precautions: None Restrictions Weight Bearing Restrictions: No Other Position/Activity Restrictions: WBAT      Mobility Bed Mobility                  Transfers                      Balance                                            ADL Overall ADL's : Needs assistance/impaired Eating/Feeding: Independent;Sitting   Grooming: Set up;Sitting   Upper Body Bathing: Minimal assitance;Sitting   Lower Body Bathing: Moderate assistance;Sit to/from stand   Upper Body Dressing : Minimal assistance;Sitting   Lower Body Dressing: Moderate assistance;Sit to/from stand                 General ADL Comments: Patient up in recliner. Session focused on LB self-care techniques. Educated on sitting to don pants/underwear, getting operated leg in first, etc. Patient reports his wife will assist with LB self-care as needed. Also discussed tub transfer -- patient reports he will sponge bathe initially until his leg has increased strength/ROM. Will practice 3 in 1 over toilet transfer next session.     Vision     Perception     Praxis      Pertinent Vitals/Pain Pain Assessment: 0-10 Pain Score: 5  Pain Location: R hip Pain Descriptors / Indicators: Aching;Sore Pain Intervention(s): Limited  activity within patient's tolerance;Monitored during session     Hand Dominance     Extremity/Trunk Assessment Upper Extremity Assessment Upper Extremity Assessment: Overall WFL for tasks assessed   Lower Extremity Assessment Lower Extremity Assessment: Defer to PT evaluation       Communication Communication Communication: No difficulties   Cognition Arousal/Alertness: Awake/alert Behavior During Therapy: WFL for tasks assessed/performed Overall Cognitive Status: Within Functional Limits for tasks assessed                     General Comments       Exercises       Shoulder Instructions      Home Living Family/patient expects to be discharged to:: Private residence Living Arrangements: Spouse/significant other Available Help at Discharge: Family Type of Home: House       Home Layout: One level     Bathroom Shower/Tub: Teacher,  years/pre: Standard Bathroom Accessibility: Yes   Home Equipment: Environmental consultant - 2 wheels;Bedside commode          Prior Functioning/Environment Level of Independence: Independent        Comments: occasional A with LB dressing by wife    OT Diagnosis: Acute pain   OT Problem List: Decreased strength;Decreased range of  motion;Decreased knowledge of use of DME or AE;Pain   OT Treatment/Interventions: Self-care/ADL training;DME and/or AE instruction;Therapeutic activities;Patient/family education    OT Goals(Current goals can be found in the care plan section) Acute Rehab OT Goals Patient Stated Goal: regain independence OT Goal Formulation: With patient Time For Goal Achievement: 10/15/15 Potential to Achieve Goals: Good ADL Goals Pt Will Transfer to Toilet: with supervision;ambulating;bedside commode Pt Will Perform Toileting - Clothing Manipulation and hygiene: with supervision;sit to/from stand  OT Frequency: Min 2X/week   Barriers to D/C:            Co-evaluation              End of  Session    Activity Tolerance: Patient tolerated treatment well Patient left: in chair;with call bell/phone within reach;with chair alarm set   Time: 1053-1105 OT Time Calculation (min): 12 min Charges:  OT General Charges $OT Visit: 1 Procedure OT Evaluation $OT Eval Low Complexity: 1 Procedure G-Codes:    Mark Valentine A Oct 22, 2015, 11:15 AM

## 2015-10-01 NOTE — Progress Notes (Signed)
     Subjective: 1 Day Post-Op Procedure(s) (LRB): RIGHT TOTAL HIP ARTHROPLASTY ANTERIOR APPROACH (Right)   Patient reports pain as moderate, pain controlled. No events throughout the night.  Feels that he is progressing slowly because he still has a bad left hip.  Look to see how well he works with PT.  Planning on discharge home eventually, when ready.  Objective:   VITALS:   Vitals:   10/01/15 0150 10/01/15 0706  BP: 120/72 (!) 142/75  Pulse: 71 63  Resp: 17 16  Temp: 97.8 F (36.6 C) 98.4 F (36.9 C)    Dorsiflexion/Plantar flexion intact Incision: dressing C/D/I No cellulitis present Compartment soft  LABS  Recent Labs  10/01/15 0429  HGB 13.0  HCT 39.0  WBC 10.9*  PLT 186     Recent Labs  10/01/15 0429  NA 135  K 4.4  BUN 9  CREATININE 1.02  GLUCOSE 137*     Assessment/Plan: 1 Day Post-Op Procedure(s) (LRB): RIGHT TOTAL HIP ARTHROPLASTY ANTERIOR APPROACH (Right) Foley cath d/c'ed Advance diet Up with therapy D/C IV fluids Discharge home with home health, eventually when ready  Obese (BMI 30-39.9) Estimated body mass index is 34.7 kg/m as calculated from the following:   Height as of this encounter: 5\' 9"  (1.753 m).   Weight as of this encounter: 106.6 kg (235 lb). Patient also counseled that weight may inhibit the healing process Patient counseled that losing weight will help with future health issues      [place] Pugh. Jeanny Rymer   PAC  10/01/2015, 9:00 AM

## 2015-10-02 LAB — BASIC METABOLIC PANEL
Anion gap: 8 (ref 5–15)
BUN: 14 mg/dL (ref 6–20)
CALCIUM: 8.4 mg/dL — AB (ref 8.9–10.3)
CHLORIDE: 103 mmol/L (ref 101–111)
CO2: 25 mmol/L (ref 22–32)
CREATININE: 1.1 mg/dL (ref 0.61–1.24)
GFR calc Af Amer: >60 mL/min (ref >60)
Glucose, Bld: 107 mg/dL — ABNORMAL HIGH (ref 65–99)
Potassium: 4.4 mmol/L (ref 3.5–5.1)
SODIUM: 136 mmol/L (ref 135–145)

## 2015-10-02 LAB — CBC
HCT: 37.5 % — ABNORMAL LOW (ref 39.0–52.0)
Hemoglobin: 12.4 g/dL — ABNORMAL LOW (ref 13.0–17.0)
MCH: 30.3 pg (ref 26.0–34.0)
MCHC: 33.1 g/dL (ref 30.0–36.0)
MCV: 91.7 fL (ref 78.0–100.0)
PLATELETS: 178 10*3/uL (ref 150–400)
RBC: 4.09 MIL/uL — ABNORMAL LOW (ref 4.22–5.81)
RDW: 13.9 % (ref 11.5–15.5)
WBC: 14.3 10*3/uL — ABNORMAL HIGH (ref 4.0–10.5)

## 2015-10-02 MED ORDER — FERROUS SULFATE 325 (65 FE) MG PO TABS: 325.0000 mg | ORAL_TABLET | Freq: Three times a day (TID) | ORAL | 3 refills | Status: DC

## 2015-10-02 MED ORDER — DOCUSATE SODIUM 100 MG PO CAPS: 100.0000 mg | ORAL_CAPSULE | Freq: Two times a day (BID) | ORAL | 0 refills | Status: DC

## 2015-10-02 MED ORDER — METHOCARBAMOL 500 MG PO TABS: 500.0000 mg | ORAL_TABLET | Freq: Four times a day (QID) | ORAL | 0 refills | Status: DC | PRN

## 2015-10-02 MED ORDER — POLYETHYLENE GLYCOL 3350 17 G PO PACK: 17.0000 g | PACK | Freq: Two times a day (BID) | ORAL | 0 refills | Status: DC

## 2015-10-02 MED ORDER — HYDROCODONE-ACETAMINOPHEN 7.5-325 MG PO TABS: 1.0000 | ORAL_TABLET | ORAL | 0 refills | Status: DC | PRN

## 2015-10-02 MED ORDER — ASPIRIN EC 81 MG PO TBEC: 81.0000 mg | DELAYED_RELEASE_TABLET | Freq: Every day | ORAL | 0 refills | Status: DC

## 2015-10-02 NOTE — Progress Notes (Signed)
Physical Therapy Treatment Patient Details Name: Mark Valentine MRN: [id number] DOB: [birth year] Today's Date: 10/02/2015    History of Present Illness s/p RIGHT TOTAL HIP ARTHROPLASTY ANTERIOR APPROACH (Right)     PT Comments    Pt progressing well with mobility and eager for return home.  Reviewed stairs, home therex and car transfers with pt and spouse.  Follow Up Recommendations  Home health PT     Equipment Recommendations  None recommended by PT    Recommendations for Other Services OT consult     Precautions / Restrictions Precautions Precautions: Fall Restrictions Weight Bearing Restrictions: No Other Position/Activity Restrictions: WBAT    Mobility  Bed Mobility Overal bed mobility: Needs Assistance Bed Mobility: Sit to Supine     Supine to sit: Min assist Sit to supine: Min assist   General bed mobility comments: cues for sequence and assist with bil LEs  Transfers Overall transfer level: Needs assistance Equipment used: Rolling walker (2 wheeled) Transfers: Sit to/from Stand Sit to Stand: Min guard;From elevated surface         General transfer comment: cues for UE placement  Ambulation/Gait Ambulation/Gait assistance: Min guard;Supervision Ambulation Distance (Feet): 90 Feet Assistive device: Rolling walker (2 wheeled) Gait Pattern/deviations: Step-to pattern;Decreased step length - right;Decreased step length - left;Shuffle;Trunk flexed Gait velocity: decr Gait velocity interpretation: Below normal speed for age/gender General Gait Details: cues for posture, sequence and position from RW   Stairs Stairs: Yes Stairs assistance: Min assist Stair Management: One rail Left;Step to pattern;Forwards;With crutches Number of Stairs: 5 General stair comments: cues for sequence and foot/crutch placement.  SPC attempted but not providing sufficient support  Wheelchair Mobility    Modified Rankin (Stroke Patients Only)       Balance                                     Cognition Arousal/Alertness: Awake/alert Behavior During Therapy: WFL for tasks assessed/performed Overall Cognitive Status: Within Functional Limits for tasks assessed                      Exercises Total Joint Exercises Ankle Circles/Pumps: AROM;Both;15 reps;Supine Quad Sets: AROM;Both;10 reps;Supine Heel Slides: AAROM;Right;20 reps;Supine Hip ABduction/ADduction: AAROM;Right;15 reps;Supine    General Comments        Pertinent Vitals/Pain Pain Assessment: 0-10 Pain Score: 3  Pain Location: R hip Pain Descriptors / Indicators: Aching;Sore Pain Intervention(s): Limited activity within patient's tolerance;Monitored during session;Premedicated before session;Ice applied    Home Living                      Prior Function            PT Goals (current goals can now be found in the care plan section) Acute Rehab PT Goals Patient Stated Goal: regain independence PT Goal Formulation: With patient Time For Goal Achievement: 10/04/15 Potential to Achieve Goals: Good Progress towards PT goals: Progressing toward goals    Frequency  7X/week    PT Plan Current plan remains appropriate    Co-evaluation             End of Session Equipment Utilized During Treatment: Gait belt Activity Tolerance: Patient tolerated treatment well Patient left: in bed;with call bell/phone within reach;with family/visitor present     Time: 0932-1023 PT Time Calculation (min) (ACUTE ONLY): 51 min  Charges:  $Gait Training: 8-22 mins $Therapeutic  Exercise: 8-22 mins $Therapeutic Activity: 8-22 mins                    G Codes:      Lavern Crimi 2015-10-19, 12:27 PM

## 2015-10-02 NOTE — Op Note (Signed)
NAME:  Mark Valentine NO.: [id number]      MEDICAL RECORD NO.: [id number]      FACILITY:  [facility]      PHYSICIAN:  Paralee Cancel D  DATE OF BIRTH:  [birth year]     DATE OF PROCEDURE:  10/02/2015                                 OPERATIVE REPORT         PREOPERATIVE DIAGNOSIS: Right  hip osteoarthritis.      POSTOPERATIVE DIAGNOSIS:  Right hip osteoarthritis.  Severe with significant lateral subluxation of hip joint and significant periarticular osteophyte formation.      PROCEDURE:  Right total hip replacement through an anterior approach   utilizing DePuy THR system, component size 62mm pinnacle cup, a size 36+4 neutral   Altrex liner, a size 5 Hi Tri Lock stem with a 36+5 delta ceramic   ball.      SURGEON:  Pietro Cassis. Alvan Dame, M.D.      ASSISTANT:  Danae Orleans, PA-C     ANESTHESIA:  Spinal.      SPECIMENS:  None.      COMPLICATIONS:  None.      BLOOD LOSS:  400 cc     DRAINS:  None.      INDICATION OF THE PROCEDURE:  Mark Valentine is a 60 y.o. male who had   presented to office for evaluation of bilateral hip pain.  Radiographs revealed   progressive degenerative changes with bone-on-bone   articulation to the  hip joint.  The patient had painful limited range of   motion of both of his hips significantly affecting their overall quality of life.  The patient was failing to    respond to conservative measures, and at this point was ready   to proceed with more definitive measures.  The patient has noted progressive   degenerative changes in his hip, progressive problems and dysfunction   with regarding the hip prior to surgery.  Consent was obtained for   benefit of pain relief.  Specific risk of infection, DVT, component   failure, dislocation, need for revision surgery, as well discussion of   the anterior versus posterior approach were reviewed.  Consent was   obtained for benefit of anterior pain relief through an  anterior   approach.      PROCEDURE IN DETAIL:  The patient was brought to operative theater.   Once adequate anesthesia, preoperative antibiotics, 2gm of Ancef, 1 gm of Tranexamic Acid, and 10 mg of Decadron administered.   The patient was positioned supine on the OSI Hanna table.  Once adequate   padding of boney process was carried out, we had predraped out the hip, and  used fluoroscopy to confirm orientation of the pelvis and position.      The right hip was then prepped and draped from proximal iliac crest to   mid thigh with shower curtain technique.      Time-out was performed identifying the patient, planned procedure, and   extremity.     An incision was then made 2 cm distal and lateral to the   anterior superior iliac spine extending over the orientation of the   tensor fascia lata muscle and sharp dissection was carried down to the  fascia of the muscle and protractor placed in the soft tissues.      The fascia was then incised.  The muscle belly was identified and swept   laterally and retractor placed along the superior neck.  Following   cauterization of the circumflex vessels and removing some pericapsular   fat, a second cobra retractor was placed on the inferior neck.  A third   retractor was placed on the anterior acetabulum after elevating the   anterior rectus.  A L-capsulotomy was along the line of the   superior neck to the trochanteric fossa, then extended proximally and   distally.  Tag sutures were placed and the retractors were then placed   intracapsular.  We then identified the trochanteric fossa and   orientation of my neck cut, confirmed this radiographically   and then made a neck osteotomy with the femur on traction.  The femoral   head was removed without difficulty or complication.  Traction was let   off and retractors were placed posterior and anterior around the   acetabulum.      The labrum and foveal tissue were debrided.  I began reaming  with a 110mm   reamer and reamed up to 40mm reamer with good bony bed preparation and a 54mm   cup was chosen.  The final 54mmm Pinnacle cup was then impacted under fluoroscopy  to confirm the depth of penetration and orientation with respect to   abduction.  A screw was placed followed by the hole eliminator.  The final   36+4 neutral Altrex liner was impacted with good visualized rim fit.  The cup was positioned anatomically within the acetabular portion of the pelvis.  At this point I removed a significant amount of osteophytes around the acetabular, circumferentially by feel and direct visualization.     At this point, the femur was rolled at 80 degrees.  Further capsule was   released off the inferior aspect of the femoral neck.  I then   released the superior capsule proximally.  The hook was placed laterally   along the femur and elevated manually and held in position with the bed   hook.  The leg was then extended and adducted with the leg rolled to 100   degrees of external rotation.  Once the proximal femur was fully   exposed, I used a box osteotome to set orientation.  I then began   broaching with the starting chili pepper broach and passed this by hand and then broached up to 5.  With the 5 broach in place I chose a high offset neck and did a trial reduction.  The offset was appropriate, leg lengths were intentially a bit longer on this side due the advanced arthritic changes of the left hip and planned total hip on that side with in the next 4-6 weeks.   Given these findings, I went ahead and dislocated the hip, repositioned all   retractors and positioned the right hip in the extended and abducted position.  The final 5 Hi Tri Lock stem was   chosen and it was impacted down to the level of neck cut.  Based on this   and the trial reduction, a 36+5 delta ceramic ball was chosen and   impacted onto a clean and dry trunnion, and the hip was reduced.  The   hip had been irrigated  throughout the case again at this point.  I did   reapproximate the superior capsular leaflet  to the anterior leaflet   using #1 Vicryl.  The fascia of the   tensor fascia lata muscle was then reapproximated using #1 Vicryl and #0 V-lock sutures.  The   remaining wound was closed with 2-0 Vicryl and running 4-0 Monocryl.   The hip was cleaned, dried, and dressed sterilely using Dermabond and   Aquacel dressing.  He was then brought   to recovery room in stable condition tolerating the procedure well.    Danae Orleans, PA-C was present for the entirety of the case involved from   preoperative positioning, perioperative retractor management, general   facilitation of the case, as well as primary wound closure as assistant.            Pietro Cassis Alvan Dame, M.D.        10/02/2015 7:42 AM

## 2015-10-02 NOTE — Progress Notes (Signed)
     Subjective: 2 Days Post-Op Procedure(s) (LRB): RIGHT TOTAL HIP ARTHROPLASTY ANTERIOR APPROACH (Right)   Patient reports pain as mild, pain controlled. No events throughout the night. Feels that he is doing well with PT, better than expected.  Planning on working with PT again for stair training today.  Ready to be discharged home.  Objective:   VITALS:   Vitals:   10/01/15 2025 10/02/15 0634  BP: 122/74 119/74  Pulse: 74 67  Resp: 18 18  Temp: 97.8 F (36.6 C) 97.8 F (36.6 C)    Dorsiflexion/Plantar flexion intact Incision: dressing C/D/I No cellulitis present Compartment soft  LABS  Recent Labs  10/01/15 0429 10/02/15 0444  HGB 13.0 12.4*  HCT 39.0 37.5*  WBC 10.9* 14.3*  PLT 186 178     Recent Labs  10/01/15 0429 10/02/15 0444  NA 135 136  K 4.4 4.4  BUN 9 14  CREATININE 1.02 1.10  GLUCOSE 137* 107*     Assessment/Plan: 2 Days Post-Op Procedure(s) (LRB): RIGHT TOTAL HIP ARTHROPLASTY ANTERIOR APPROACH (Right) Up with therapy Discharge home with home health  Follow up in 2 weeks at [facility]. Follow up with OLIN,Kassadi Presswood D in 2 weeks.  Contact information:  [facility] [address], [address] [place]   PAC  10/02/2015, 8:22 AM

## 2015-10-02 NOTE — Progress Notes (Signed)
Occupational Therapy Treatment Patient Details Name: Mark Valentine MRN: [id number] DOB: December 18, 1955 Today's Date: 10/02/2015    History of present illness s/p RIGHT TOTAL HIP ARTHROPLASTY ANTERIOR APPROACH (Right)    OT comments  All education completed this session; no further OT needs  Follow Up Recommendations  No OT follow up;Supervision/Assistance - 24 hour    Equipment Recommendations  None recommended by OT    Recommendations for Other Services      Precautions / Restrictions Precautions Precautions: Fall Restrictions Weight Bearing Restrictions: No       Mobility Bed Mobility         Supine to sit: Min assist     General bed mobility comments: assist for RLE  Transfers Overall transfer level: Needs assistance Equipment used: Rolling walker (2 wheeled)   Sit to Stand: Min guard;From elevated surface         General transfer comment: cues for UE placement    Balance                                   ADL                           Toilet Transfer: Min guard;Ambulation;BSC;RW             General ADL Comments: stood to use urinal with min guard.  Wanted to sit to brush teeth as pain increases when he is standing. Wife will assist with adls; she is very involved and hands on      Vision                     Perception     Praxis      Cognition   Behavior During Therapy: WFL for tasks assessed/performed Overall Cognitive Status: Within Functional Limits for tasks assessed                       Extremity/Trunk Assessment               Exercises     Shoulder Instructions       General Comments      Pertinent Vitals/ Pain       Pain Assessment: 0-10 Pain Score: 4  (weight bearing) Pain Location: R hip Pain Descriptors / Indicators: Sore Pain Intervention(s): Limited activity within patient's tolerance;Monitored during session;Premedicated before session;Repositioned;Ice  applied  Home Living                                          Prior Functioning/Environment              Frequency       Progress Toward Goals  OT Goals(current goals can now be found in the care plan section)  Progress towards OT goals: Progressing toward goals (goals not met; but no further OT needs)     Plan      Co-evaluation                 End of Session     Activity Tolerance Patient tolerated treatment well   Patient Left in chair;with call bell/phone within reach;with family/visitor present   Nurse Communication          Time: 4580-9983 OT Time Calculation (  min): 26 min  Charges: OT General Charges $OT Visit: 1 Procedure OT Treatments $Self Care/Home Management : 23-37 mins  Eriel Doyon 10/02/2015, 9:04 AM  Lesle Chris, OTR/L [phone] 10/02/2015

## 2015-10-02 NOTE — Discharge Instructions (Signed)

## 2015-10-07 NOTE — Discharge Summary (Signed)
Physician Discharge Summary  Patient ID: [name] MRN: [id number] DOB/AGE: 59-Aug-1957 58 y.o.  Admit date: 09/30/2015 Discharge date: 10/02/2015   Procedures:  Procedure(s) (LRB): RIGHT TOTAL HIP ARTHROPLASTY ANTERIOR APPROACH (Right)  Attending Physician:  Dr. Paralee Cancel   Admission Diagnoses:   Right hip primary OA / pain  Discharge Diagnoses:  Principal Problem:   S/P right THA, AA Active Problems:   Obesity  Past Medical History:  Diagnosis Date  . Anal fissure 1980  . Hip pain, bilateral    pending surgery 09/2015, [facility]  . History of testicular cancer   . Hypertension 09/24/2015   no meds currently  . Lipoprotein deficiencies   . Low HDL (under 40)   . Osteoarthritis of hip   . Prostate cancer [facility]) 2017   robotic prostatectomy  . Wears glasses     HPI:    Mark Valentine, 60 y.o. male, has a history of pain and functional disability in the right hip(s) due to arthritis and patient has failed non-surgical conservative treatments for greater than 12 weeks to include NSAID's and/or analgesics and activity modification.  Onset of symptoms was gradual starting 15+ years ago with gradually worsening course since that time.The patient noted no past surgery on the right hip(s).  Patient currently rates pain in the right hip at 10 out of 10 with activity. Patient has night pain, worsening of pain with activity and weight bearing, trendelenberg gait, pain that interfers with activities of daily living and pain with passive range of motion. Patient has evidence of periarticular osteophytes and joint space narrowing by imaging studies. This condition presents safety issues increasing the risk of falls. There is no current active infection.   Risks, benefits and expectations were discussed with the patient.  Risks including but not limited to the risk of anesthesia, blood clots, nerve damage, blood vessel damage, failure of the prosthesis, infection and up to and  including death.  Patient understand the risks, benefits and expectations and wishes to proceed with surgery.   PCP: Crisoforo Oxford, PA-C   Discharged Condition: good  Hospital Course:  Patient underwent the above stated procedure on 09/30/2015. Patient tolerated the procedure well and brought to the recovery room in good condition and subsequently to the floor.  POD #1 BP: 142/75 ; Pulse: 63 ; Temp: 98.4 F (36.9 C) ; Resp: 16 Patient reports pain as moderate, pain controlled. No events throughout the night.  Feels that he is progressing slowly because he still has a bad left hip.  Look to see how well he works with PT. Dorsiflexion/plantar flexion intact, incision: dressing C/D/I, no cellulitis present and compartment soft.   LABS  Basename    HGB     13.0  HCT     39.0   POD #2  BP: 119/74 ; Pulse: 67 ; Temp: 97.8 F (36.6 C) ; Resp: 18 Patient reports pain as mild, pain controlled. No events throughout the night. Feels that he is doing well with PT, better than expected.  Planning on working with PT again for stair training today.  Ready to be discharged home. Dorsiflexion/plantar flexion intact, incision: dressing C/D/I, no cellulitis present and compartment soft.   LABS  Basename    HGB     12.4  HCT     37.5    Discharge Exam: General appearance: alert, cooperative and no distress Extremities: Homans sign is negative, no sign of DVT, no edema, redness or tenderness in the calves  or thighs and no ulcers, gangrene or trophic changes  Disposition: Home with follow up in 2 weeks   Follow-up Information    Mark Pole, MD. Schedule an appointment as soon as possible for a visit in 2 week(s).   Specialty:  Orthopedic Surgery Contact information: [address] [place] [address] [phone]           Discharge Instructions    Call MD / Call 911    Complete by:  As directed   If you experience chest pain or shortness of breath, CALL 911  and be transported to the hospital emergency room.  If you develope a fever above 101 F, pus (white drainage) or increased drainage or redness at the wound, or calf pain, call your surgeon's office.   Change dressing    Complete by:  As directed   Maintain surgical dressing until follow up in the clinic. If the edges start to pull up, may reinforce with tape. If the dressing is no longer working, may remove and cover with gauze and tape, but must keep the area dry and clean.  Call with any questions or concerns.   Constipation Prevention    Complete by:  As directed   Drink plenty of fluids.  Prune juice may be helpful.  You may use a stool softener, such as Colace (over the counter) 100 mg twice a day.  Use MiraLax (over the counter) for constipation as needed.   Diet - low sodium heart healthy    Complete by:  As directed   Discharge instructions    Complete by:  As directed   Maintain surgical dressing until follow up in the clinic. If the edges start to pull up, may reinforce with tape. If the dressing is no longer working, may remove and cover with gauze and tape, but must keep the area dry and clean.  Follow up in 2 weeks at [facility]. Call with any questions or concerns.   Increase activity slowly as tolerated    Complete by:  As directed   Weight bearing as tolerated with assist device (walker, cane, etc) as directed, use it as long as suggested by your surgeon or therapist, typically at least 4-6 weeks.   TED hose    Complete by:  As directed   Use stockings (TED hose) for 2 weeks on both leg(s).  You may remove them at night for sleeping.        Medication List    STOP taking these medications   traMADol 50 MG tablet Commonly known as:  ULTRAM     TAKE these medications   aspirin EC 81 MG tablet Take 1 tablet (81 mg total) by mouth daily. Take for 4 weeks, then resume regular dose. What changed:  additional instructions  Another medication with the same name was  removed. Continue taking this medication, and follow the directions you see here.   cetirizine 10 MG tablet Commonly known as:  ZYRTEC Take 10 mg by mouth daily. Reported on 09/17/2015   docusate sodium 100 MG capsule Commonly known as:  COLACE Take 1 capsule (100 mg total) by mouth 2 (two) times daily.   ferrous sulfate 325 (65 FE) MG tablet Take 1 tablet (325 mg total) by mouth 3 (three) times daily after meals.   HYDROcodone-acetaminophen 7.5-325 MG tablet Commonly known as:  NORCO Take 1-2 tablets by mouth every 4 (four) hours as needed for moderate pain.   methocarbamol 500 MG tablet Commonly  known as:  ROBAXIN Take 1 tablet (500 mg total) by mouth every 6 (six) hours as needed for muscle spasms.   polyethylene glycol packet Commonly known as:  MIRALAX / GLYCOLAX Take 17 g by mouth 2 (two) times daily.   pravastatin 20 MG tablet Commonly known as:  PRAVACHOL Take 1 tablet (20 mg total) by mouth daily.        Signed: West Pugh. Joven Mom   PA-C  10/07/2015, 3:37 PM

## 2015-10-29 NOTE — H&P (Signed)
TOTAL HIP ADMISSION H&P  Patient is admitted for left total hip arthroplasty, anterior approach.  Subjective:  Chief Complaint:    Left hip primary OA / pain  HPI: Mark Valentine, 60 y.o. male, has a history of pain and functional disability in the left hip(s) due to arthritis and patient has failed non-surgical conservative treatments for greater than 12 weeks to include NSAID's and/or analgesics and activity modification.  Onset of symptoms was gradual starting 15+ years ago with gradually worsening course since that time.The patient noted prior procedures of the hip to include arthroplasty on the right hip(s).  Patient currently rates pain in the left hip at 8 out of 10 with activity. Patient has night pain, worsening of pain with activity and weight bearing, trendelenberg gait, pain that interfers with activities of daily living and pain with passive range of motion. Patient has evidence of periarticular osteophytes and joint space narrowing by imaging studies. This condition presents safety issues increasing the risk of falls.  There is no current active infection.   Risks, benefits and expectations were discussed with the patient.  Risks including but not limited to the risk of anesthesia, blood clots, nerve damage, blood vessel damage, failure of the prosthesis, infection and up to and including death.  Patient understand the risks, benefits and expectations and wishes to proceed with surgery.   PCP: Crisoforo Oxford, PA-C  D/C Plans:      Home with HHPT  Post-op Meds:       No Rx given  Tranexamic Acid:      To be given - IV   Decadron:      Is to be given  FYI:     ASA  Norco    Patient Active Problem List   Diagnosis Date Noted  . S/P right THA, AA 09/30/2015  . Encounter for health maintenance examination in adult 09/17/2015  . Preop examination 09/17/2015  . Vaccine counseling 09/17/2015  . 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 Essential hypertension 07/17/2014  .  Impaired fasting blood sugar 07/17/2014  . Mixed dyslipidemia 07/17/2014  . Obesity 07/17/2014   Past Medical History:  Diagnosis Date  . Anal fissure 1980  . Hip pain, bilateral    pending surgery 09/2015, [facility]  . History of testicular cancer   . Hypertension 09/24/2015   no meds currently  . Lipoprotein deficiencies   . Low HDL (under 40)   . Osteoarthritis of hip   . Prostate cancer [facility]) 2017   robotic prostatectomy  . Wears glasses     Past Surgical History:  Procedure Laterality Date  . anal fistula repair  1980's  . COLONOSCOPY  02/01/11   normal, repeat 2022; Dr. Sharlett Iles  . ORCHIECTOMY  1997  . PROSTATE BIOPSY  2010   Dr. Amalia Hailey, Urology  . PROSTATECTOMY  05/2015   robotic  . TOTAL HIP ARTHROPLASTY Right 09/30/2015   Procedure: RIGHT TOTAL HIP ARTHROPLASTY ANTERIOR APPROACH;  Surgeon: Paralee Cancel, MD;  Location: [facility];  Service: Orthopedics;  Laterality: Right;    No prescriptions prior to admission.   Allergies  Allergen Reactions  . Mobic [Meloxicam] Other (See Comments)    bleeding    Social History  Substance Use Topics  . Smoking status: Never Smoker  . Smokeless tobacco: Never Used  . Alcohol use No    Family History  Problem Relation Age of Onset  . Arthritis Mother   . Diabetes Mother   . Stroke Mother   .  Hypertension Mother   . Heart disease Father     aortic disease, possible dissection  . Hypertension Sister   . Mental illness Sister   . Diabetes Sister   . Hypertension Brother      Review of Systems  Constitutional: Negative.   HENT: Negative.   Eyes: Negative.   Respiratory: Negative.   Cardiovascular: Negative.   Gastrointestinal: Negative.   Genitourinary: Positive for frequency.  Musculoskeletal: Positive for joint pain.  Skin: Negative.   Neurological: Negative.   Endo/Heme/Allergies: Negative.   Psychiatric/Behavioral: Negative.     Objective:  Physical Exam  Constitutional: He is oriented to person,  place, and time. He appears well-developed.  HENT:  Head: Normocephalic.  Eyes: Pupils are equal, round, and reactive to light.  Neck: Neck supple. No JVD present. No tracheal deviation present. No thyromegaly present.  Cardiovascular: Normal rate, regular rhythm, normal heart sounds and intact distal pulses.   Respiratory: Effort normal and breath sounds normal. No respiratory distress. He has no wheezes.  GI: Soft. There is no tenderness. There is no guarding.  Musculoskeletal:       Left hip: He exhibits decreased range of motion, decreased strength, tenderness and bony tenderness. He exhibits no swelling, no deformity and no laceration.  Lymphadenopathy:    He has no cervical adenopathy.  Neurological: He is alert and oriented to person, place, and time.  Skin: Skin is warm and dry.  Psychiatric: He has a normal mood and affect.      Labs:  Estimated body mass index is 34.7 kg/m as calculated from the following:   Height as of 09/30/15: 5\' 9"  (1.753 m).   Weight as of 09/30/15: 106.6 kg (235 lb).   Imaging Review Plain radiographs demonstrate severe degenerative joint disease of the left hip(s). The bone quality appears to be good for age and reported activity level.  Assessment/Plan:  End stage arthritis, left hip(s)  The patient history, physical examination, clinical judgement of the provider and imaging studies are consistent with end stage degenerative joint disease of the left hip(s) and total hip arthroplasty is deemed medically necessary. The treatment options including medical management, injection therapy, arthroscopy and arthroplasty were discussed at length. The risks and benefits of total hip arthroplasty were presented and reviewed. The risks due to aseptic loosening, infection, stiffness, dislocation/subluxation,  thromboembolic complications and other imponderables were discussed.  The patient acknowledged the explanation, agreed to proceed with the plan and consent  was signed. Patient is being admitted for inpatient treatment for surgery, pain control, PT, OT, prophylactic antibiotics, VTE prophylaxis, progressive ambulation and ADL's and discharge planning.The patient is planning to be discharged home with home health services.      Mark Pugh Joshlynn Alfonzo   PA-C  10/29/2015, 2:15 PM

## 2015-11-05 ENCOUNTER — Encounter (HOSPITAL_COMMUNITY): Payer: Self-pay

## 2015-11-05 ENCOUNTER — Encounter (HOSPITAL_COMMUNITY)
Admission: RE | Admit: 2015-11-05 | Discharge: 2015-11-05 | Disposition: A | Discharge status: Home / Self Care | Payer: BC Managed Care – PPO | Source: Ambulatory Visit | Attending: Orthopedic Surgery | Admitting: Orthopedic Surgery

## 2015-11-05 DIAGNOSIS — Z01812 Encounter for preprocedural laboratory examination: Secondary | ICD-10-CM | POA: Diagnosis present

## 2015-11-05 LAB — BASIC METABOLIC PANEL
Anion gap: 6 (ref 5–15)
BUN: 12 mg/dL (ref 6–20)
CHLORIDE: 104 mmol/L (ref 101–111)
CO2: 29 mmol/L (ref 22–32)
CREATININE: 1.16 mg/dL (ref 0.61–1.24)
Calcium: 9.5 mg/dL (ref 8.9–10.3)
GFR calc Af Amer: >60 mL/min (ref >60)
GLUCOSE: 112 mg/dL — AB (ref 65–99)
POTASSIUM: 4.1 mmol/L (ref 3.5–5.1)
SODIUM: 139 mmol/L (ref 135–145)

## 2015-11-05 LAB — CBC
HCT: 41.4 % (ref 39.0–52.0)
Hemoglobin: 13.9 g/dL (ref 13.0–17.0)
MCH: 30.5 pg (ref 26.0–34.0)
MCHC: 33.6 g/dL (ref 30.0–36.0)
MCV: 90.8 fL (ref 78.0–100.0)
PLATELETS: 204 10*3/uL (ref 150–400)
RBC: 4.56 MIL/uL (ref 4.22–5.81)
RDW: 13.7 % (ref 11.5–15.5)
WBC: 5.6 10*3/uL (ref 4.0–10.5)

## 2015-11-05 LAB — SURGICAL PCR SCREEN
MRSA, PCR: NEGATIVE
STAPHYLOCOCCUS AUREUS: NEGATIVE

## 2015-11-05 NOTE — Patient Instructions (Addendum)
Mark Valentine  11/05/2015   Your procedure is scheduled on: 11-11-15   Report to [facility] Main  Entrance take [facility]  elevators to 3rd floor to  [place] at  0830  AM.  Call this number if you have problems the morning of surgery [phone]   Remember: ONLY 1 PERSON MAY GO WITH YOU TO SHORT STAY TO GET  READY MORNING OF [name]  Do not eat food or drink liquids :After Midnight.     Take these medicines the morning of surgery with A SIP OF WATER:  None. -Tylenol if needed. DO NOT TAKE ANY DIABETIC MEDICATIONS DAY OF YOUR SURGERY                               You may not have any metal on your body including hair pins and              piercings  Do not wear jewelry, make-up, lotions, powders or perfumes, deodorant             Do not wear nail polish.  Do not shave  48 hours prior to surgery.              Men may shave face and neck.   Do not bring valuables to the hospital. [place].  Contacts, dentures or bridgework may not be worn into surgery.  Leave suitcase in the car. After surgery it may be brought to your room.     Patients discharged the day of surgery will not be allowed to drive home.  Name and phone number of your driver: Mark Valentine- spouse -[phone] cell  Special Instructions: N/A              Please read over the following fact sheets you were given: _____________________________________________________________________[facility] - Preparing for Surgery Before surgery, you can play an important role.  Because skin is not sterile, your skin needs to be as free of germs as possible.  You can reduce the number of germs on your skin by washing with CHG (chlorahexidine gluconate) soap before surgery.  CHG is an antiseptic cleaner which kills germs and bonds with the skin to continue killing germs even after washing. Please DO NOT use if you have an allergy to CHG or  antibacterial soaps.  If your skin becomes reddened/irritated stop using the CHG and inform your nurse when you arrive at Short Stay. Do not shave (including legs and underarms) for at least 48 hours prior to the first CHG shower.  You may shave your face/neck. Please follow these instructions carefully:  1.  Shower with CHG Soap the night before surgery and the  morning of Surgery.  2.  If you choose to wash your hair, wash your hair first as usual with your  normal  shampoo.  3.  After you shampoo, rinse your hair and body thoroughly to remove the  shampoo.                           4.  Use CHG as you would any other liquid soap.  You can apply chg directly  to the skin and wash  Gently with a scrungie or clean washcloth.  5.  Apply the CHG Soap to your body ONLY FROM THE NECK DOWN.   Do not use on face/ open                           Wound or open sores. Avoid contact with eyes, ears mouth and genitals (private parts).                       Wash face,  Genitals (private parts) with your normal soap.             6.  Wash thoroughly, paying special attention to the area where your surgery  will be performed.  7.  Thoroughly rinse your body with warm water from the neck down.  8.  DO NOT shower/wash with your normal soap after using and rinsing off  the CHG Soap.                9.  Pat yourself dry with a clean towel.            10.  Wear clean pajamas.            11.  Place clean sheets on your bed the night of your first shower and do not  sleep with pets. Day of Surgery : Do not apply any lotions/deodorants the morning of surgery.  Please wear clean clothes to the hospital/surgery center.  FAILURE TO FOLLOW THESE INSTRUCTIONS MAY RESULT IN THE CANCELLATION OF YOUR SURGERY PATIENT SIGNATURE_________________________________  NURSE SIGNATURE__________________________________  ________________________________________________________________________                 Mark Valentine  An incentive spirometer is a tool that can help keep your lungs clear and active. This tool measures how well you are filling your lungs with each breath. Taking long deep breaths may help reverse or decrease the chance of developing breathing (pulmonary) problems (especially infection) following:  A long period of time when you are unable to move or be active. BEFORE THE PROCEDURE   If the spirometer includes an indicator to show your best effort, your nurse or respiratory therapist will set it to a desired goal.  If possible, sit up straight or lean slightly forward. Try not to slouch.  Hold the incentive spirometer in an upright position. INSTRUCTIONS FOR USE  1. Sit on the edge of your bed if possible, or sit up as far as you can in bed or on a chair. 2. Hold the incentive spirometer in an upright position. 3. Breathe out normally. 4. Place the mouthpiece in your mouth and seal your lips tightly around it. 5. Breathe in slowly and as deeply as possible, raising the piston or the ball toward the top of the column. 6. Hold your breath for 3-5 seconds or for as long as possible. Allow the piston or ball to fall to the bottom of the column. 7. Remove the mouthpiece from your mouth and breathe out normally. 8. Rest for a few seconds and repeat Steps 1 through 7 at least 10 times every 1-2 hours when you are awake. Take your time and take a few normal breaths between deep breaths. 9. The spirometer may include an indicator to show your best effort. Use the indicator as a goal to work toward during each repetition. 10. After each set of 10 deep breaths, practice coughing to be sure your lungs are clear.  If you have an incision (the cut made at the time of surgery), support your incision when coughing by placing a pillow or rolled up towels firmly against it. Once you are able to get out of bed, walk around indoors and cough well. You may stop using the incentive  spirometer when instructed by your caregiver.  RISKS AND COMPLICATIONS  Take your time so you do not get dizzy or light-headed.  If you are in pain, you may need to take or ask for pain medication before doing incentive spirometry. It is harder to take a deep breath if you are having pain. AFTER USE  Rest and breathe slowly and easily.  It can be helpful to keep track of a log of your progress. Your caregiver can provide you with a simple table to help with this. If you are using the spirometer at home, follow these instructions: [place] IF:   You are having difficultly using the spirometer.  You have trouble using the spirometer as often as instructed.  Your pain medication is not giving enough relief while using the spirometer.  You develop fever of 100.5 F (38.1 C) or higher. SEEK IMMEDIATE MEDICAL CARE IF:   You cough up bloody sputum that had not been present before.  You develop fever of 102 F (38.9 C) or greater.  You develop worsening pain at or near the incision site. MAKE SURE YOU:   Understand these instructions.  Will watch your condition.  Will get help right away if you are not doing well or get worse. Document Released: 06/28/2006 Document Revised: 05/10/2011 Document Reviewed: 08/29/2006 Exit[employer] Patient Information 2014 [employer], [place].   ________________________________________________________________________  WHAT IS A BLOOD TRANSFUSION? Blood Transfusion Information  A transfusion is the replacement of blood or some of its parts. Blood is made up of multiple cells which provide different functions.  Red blood cells carry oxygen and are used for blood loss replacement.  White blood cells fight against infection.  Platelets control bleeding.  Plasma helps clot blood.  Other blood products are available for specialized needs, such as hemophilia or other clotting disorders. BEFORE THE TRANSFUSION  Who gives blood for transfusions?    Healthy volunteers who are fully evaluated to make sure their blood is safe. This is blood bank blood. Transfusion therapy is the safest it has ever been in the practice of medicine. Before blood is taken from a donor, a complete history is taken to make sure that person has no history of diseases nor engages in risky social behavior (examples are intravenous drug use or sexual activity with multiple partners). The donor's travel history is screened to minimize risk of transmitting infections, such as malaria. The donated blood is tested for signs of infectious diseases, such as HIV and hepatitis. The blood is then tested to be sure it is compatible with you in order to minimize the chance of a transfusion reaction. If you or a relative donates blood, this is often done in anticipation of surgery and is not appropriate for emergency situations. It takes many days to process the donated blood. RISKS AND COMPLICATIONS Although transfusion therapy is very safe and saves many lives, the main dangers of transfusion include:   Getting an infectious disease.  Developing a transfusion reaction. This is an allergic reaction to something in the blood you were given. Every precaution is taken to prevent this. The decision to have a blood transfusion has been considered carefully by your caregiver before blood is given.  Blood is not given unless the benefits outweigh the risks. AFTER THE TRANSFUSION  Right after receiving a blood transfusion, you will usually feel much better and more energetic. This is especially true if your red blood cells have gotten low (anemic). The transfusion raises the level of the red blood cells which carry oxygen, and this usually causes an energy increase.  The nurse administering the transfusion will monitor you carefully for complications. HOME CARE INSTRUCTIONS  No special instructions are needed after a transfusion. You may find your energy is better. Speak with your  caregiver about any limitations on activity for underlying diseases you may have. SEEK MEDICAL CARE IF:   Your condition is not improving after your transfusion.  You develop redness or irritation at the intravenous (IV) site. SEEK IMMEDIATE MEDICAL CARE IF:  Any of the following symptoms occur over the next 12 hours:  Shaking chills.  You have a temperature by mouth above 102 F (38.9 C), not controlled by medicine.  Chest, back, or muscle pain.  People around you feel you are not acting correctly or are confused.  Shortness of breath or difficulty breathing.  Dizziness and fainting.  You get a rash or develop hives.  You have a decrease in urine output.  Your urine turns a dark color or changes to pink, red, or brown. Any of the following symptoms occur over the next 10 days:  You have a temperature by mouth above 102 F (38.9 C), not controlled by medicine.  Shortness of breath.  Weakness after normal activity.  The white part of the eye turns yellow (jaundice).  You have a decrease in the amount of urine or are urinating less often.  Your urine turns a dark color or changes to pink, red, or brown. Document Released: 02/13/2000 Document Revised: 05/10/2011 Document Reviewed: 10/02/2007 [facility] Patient Information 2014 [place], [place].  _______________________________________________________________________

## 2015-11-05 NOTE — Pre-Procedure Instructions (Signed)
EKG 7'17 Epic 

## 2015-11-10 NOTE — Anesthesia Preprocedure Evaluation (Addendum)
Anesthesia Evaluation  Patient identified by MRN, date of birth, ID band Patient awake    Reviewed: Allergy & Precautions, NPO status , Patient's Chart, lab work & pertinent test results  History of Anesthesia Complications Negative for: history of anesthetic complications  Airway Mallampati: II  TM Distance: >3 FB Neck ROM: Full   Comment: Previous grade I view with [employer] [facility] [employer]  (+) Dental Advisory Given   Pulmonary neg pulmonary ROS,    Pulmonary exam normal breath sounds clear to auscultation       Cardiovascular hypertension, (-) angina(-) Past MI, (-) Cardiac Stents and (-) CABG  Rhythm:Regular Rate:Normal     Neuro/Psych negative neurological ROS     GI/Hepatic negative GI ROS, Neg liver ROS,   Endo/Other  negative endocrine ROS  Renal/GU negative Renal ROS     Musculoskeletal  (+) Arthritis ,   Abdominal (+) + obese,   Peds  Hematology negative hematology ROS (+)   Anesthesia Other Findings HLD, 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   Dental advisory given  Plan Discussed with: CRNA  Anesthesia Plan Comments: (Patient had GA with previous THA on 09/30/2015 and prefers GA this time as well. He declines a spinal and says he does not like them.  Risks of general  anesthesia discussed including, but not limited to, sore throat, hoarse voice, chipped/damaged teeth, injury to vocal cords, nausea and vomiting, allergic reactions, lung infection, heart attack, stroke, and death. All questions answered.  Platelets 204)      Anesthesia Quick Evaluation

## 2015-11-11 ENCOUNTER — Encounter (HOSPITAL_COMMUNITY)
Admission: RE | Disposition: A | Discharge status: Home Health | Payer: Self-pay | Source: Ambulatory Visit | Attending: Orthopedic Surgery

## 2015-11-11 ENCOUNTER — Inpatient Hospital Stay (HOSPITAL_COMMUNITY): Payer: BC Managed Care – PPO | Admitting: Anesthesiology

## 2015-11-11 ENCOUNTER — Encounter (HOSPITAL_COMMUNITY): Payer: Self-pay | Admitting: *Deleted

## 2015-11-11 ENCOUNTER — Inpatient Hospital Stay (HOSPITAL_COMMUNITY): Payer: BC Managed Care – PPO

## 2015-11-11 ENCOUNTER — Inpatient Hospital Stay (HOSPITAL_COMMUNITY)
Admission: RE | Admit: 2015-11-11 | Discharge: 2015-11-13 | DRG: 470 | Disposition: A | Discharge status: Home Health | Payer: BC Managed Care – PPO | Source: Ambulatory Visit | Attending: Orthopedic Surgery | Admitting: Orthopedic Surgery

## 2015-11-11 ENCOUNTER — Inpatient Hospital Stay: Admit: 2015-11-11 | Payer: BC Managed Care – PPO | Admitting: Orthopedic Surgery

## 2015-11-11 DIAGNOSIS — Y92239 Unspecified place in hospital as the place of occurrence of the external cause: Secondary | ICD-10-CM | POA: Diagnosis present

## 2015-11-11 DIAGNOSIS — Z888 Allergy status to other drugs, medicaments and biological substances status: Secondary | ICD-10-CM

## 2015-11-11 DIAGNOSIS — Y838 Other surgical procedures as the cause of abnormal reaction of the patient, or of later complication, without mention of misadventure at the time of the procedure: Secondary | ICD-10-CM | POA: Diagnosis present

## 2015-11-11 DIAGNOSIS — T84021A Dislocation of internal left hip prosthesis, initial encounter: Secondary | ICD-10-CM | POA: Diagnosis present

## 2015-11-11 DIAGNOSIS — M25552 Pain in left hip: Secondary | ICD-10-CM | POA: Diagnosis present

## 2015-11-11 DIAGNOSIS — M1612 Unilateral primary osteoarthritis, left hip: Principal | ICD-10-CM | POA: Diagnosis present

## 2015-11-11 DIAGNOSIS — Z6833 Body mass index (BMI) 33.0-33.9, adult: Secondary | ICD-10-CM | POA: Diagnosis not present

## 2015-11-11 DIAGNOSIS — I10 Essential (primary) hypertension: Secondary | ICD-10-CM | POA: Diagnosis present

## 2015-11-11 DIAGNOSIS — Z8547 Personal history of malignant neoplasm of testis: Secondary | ICD-10-CM | POA: Diagnosis not present

## 2015-11-11 DIAGNOSIS — E669 Obesity, unspecified: Secondary | ICD-10-CM | POA: Diagnosis present

## 2015-11-11 DIAGNOSIS — Z8546 Personal history of malignant neoplasm of prostate: Secondary | ICD-10-CM | POA: Diagnosis not present

## 2015-11-11 DIAGNOSIS — Z8249 Family history of ischemic heart disease and other diseases of the circulatory system: Secondary | ICD-10-CM | POA: Diagnosis not present

## 2015-11-11 DIAGNOSIS — Z96649 Presence of unspecified artificial hip joint: Secondary | ICD-10-CM

## 2015-11-11 HISTORY — PX: TOTAL HIP ARTHROPLASTY: SHX124

## 2015-11-11 HISTORY — PX: HIP CLOSED REDUCTION: SHX983

## 2015-11-11 LAB — TYPE AND SCREEN
ABO/RH(D): B POS
ANTIBODY SCREEN: NEGATIVE

## 2015-11-11 SURGERY — CLOSED REDUCTION, HIP
Anesthesia: General | Site: Hip | Laterality: Left

## 2015-11-11 SURGERY — ARTHROPLASTY, HIP, TOTAL, ANTERIOR APPROACH
Anesthesia: General | Site: Hip | Laterality: Left

## 2015-11-11 MED ORDER — MIDAZOLAM HCL 2 MG/2ML IJ SOLN
INTRAMUSCULAR | Status: AC
  Filled 2015-11-11: qty 2

## 2015-11-11 MED ORDER — SODIUM CHLORIDE 0.9 % IR SOLN
Status: DC | PRN
  Administered 2015-11-11: 1000 mL

## 2015-11-11 MED ORDER — DOCUSATE SODIUM 100 MG PO CAPS
100.0000 mg | ORAL_CAPSULE | Freq: Two times a day (BID) | ORAL | Status: DC
  Administered 2015-11-11 – 2015-11-13 (×4): 100 mg via ORAL
  Filled 2015-11-11 (×4): qty 1

## 2015-11-11 MED ORDER — FENTANYL CITRATE (PF) 100 MCG/2ML IJ SOLN
INTRAMUSCULAR | Status: AC
  Filled 2015-11-11: qty 2

## 2015-11-11 MED ORDER — PROPOFOL 10 MG/ML IV BOLUS
INTRAVENOUS | Status: AC
  Filled 2015-11-11: qty 20

## 2015-11-11 MED ORDER — MIDAZOLAM HCL 5 MG/5ML IJ SOLN
INTRAMUSCULAR | Status: DC | PRN
  Administered 2015-11-11: 2 mg via INTRAVENOUS

## 2015-11-11 MED ORDER — EPHEDRINE SULFATE 50 MG/ML IJ SOLN
INTRAMUSCULAR | Status: DC | PRN
  Administered 2015-11-11 (×3): 10 mg via INTRAVENOUS

## 2015-11-11 MED ORDER — ONDANSETRON HCL 4 MG/2ML IJ SOLN
INTRAMUSCULAR | Status: DC | PRN
  Administered 2015-11-11: 4 mg via INTRAVENOUS

## 2015-11-11 MED ORDER — METHOCARBAMOL 1000 MG/10ML IJ SOLN
500.0000 mg | Freq: Four times a day (QID) | INTRAVENOUS | Status: DC | PRN
  Administered 2015-11-11: 500 mg via INTRAVENOUS
  Filled 2015-11-11: qty 5
  Filled 2015-11-11: qty 550

## 2015-11-11 MED ORDER — DEXAMETHASONE SODIUM PHOSPHATE 10 MG/ML IJ SOLN
INTRAMUSCULAR | Status: AC
  Filled 2015-11-11: qty 1

## 2015-11-11 MED ORDER — SODIUM CHLORIDE 0.9 % IJ SOLN
INTRAMUSCULAR | Status: AC
  Filled 2015-11-11: qty 10

## 2015-11-11 MED ORDER — DEXAMETHASONE SODIUM PHOSPHATE 10 MG/ML IJ SOLN
10.0000 mg | Freq: Once | INTRAMUSCULAR | Status: AC
  Administered 2015-11-12: 10 mg via INTRAVENOUS
  Filled 2015-11-11: qty 1

## 2015-11-11 MED ORDER — SUGAMMADEX SODIUM 500 MG/5ML IV SOLN
INTRAVENOUS | Status: DC | PRN
  Administered 2015-11-11: 300 mg via INTRAVENOUS

## 2015-11-11 MED ORDER — METOCLOPRAMIDE HCL 5 MG/ML IJ SOLN
5.0000 mg | Freq: Three times a day (TID) | INTRAMUSCULAR | Status: DC | PRN
  Administered 2015-11-11: 10 mg via INTRAVENOUS
  Filled 2015-11-11: qty 2

## 2015-11-11 MED ORDER — METHOCARBAMOL 500 MG PO TABS
500.0000 mg | ORAL_TABLET | Freq: Four times a day (QID) | ORAL | Status: DC | PRN
  Administered 2015-11-12: 500 mg via ORAL
  Filled 2015-11-11 (×2): qty 1

## 2015-11-11 MED ORDER — MAGNESIUM CITRATE PO SOLN: 1.0000 | Freq: Once | ORAL | Status: DC | PRN

## 2015-11-11 MED ORDER — PROPOFOL 10 MG/ML IV BOLUS
INTRAVENOUS | Status: DC | PRN
  Administered 2015-11-11: 180 mg via INTRAVENOUS

## 2015-11-11 MED ORDER — POLYETHYLENE GLYCOL 3350 17 G PO PACK: 17.0000 g | PACK | Freq: Two times a day (BID) | ORAL | 0 refills | Status: DC

## 2015-11-11 MED ORDER — MENTHOL 3 MG MT LOZG: 1.0000 | LOZENGE | OROMUCOSAL | Status: DC | PRN

## 2015-11-11 MED ORDER — PROPOFOL 500 MG/50ML IV EMUL
INTRAVENOUS | Status: DC | PRN
  Administered 2015-11-11: 180 mg via INTRAVENOUS

## 2015-11-11 MED ORDER — FERROUS SULFATE 325 (65 FE) MG PO TABS
325.0000 mg | ORAL_TABLET | Freq: Three times a day (TID) | ORAL | Status: DC
  Administered 2015-11-11 – 2015-11-12 (×3): 325 mg via ORAL
  Filled 2015-11-11 (×5): qty 1

## 2015-11-11 MED ORDER — LIDOCAINE 2% (20 MG/ML) 5 ML SYRINGE
INTRAMUSCULAR | Status: DC | PRN
  Administered 2015-11-11: 50 mg via INTRAVENOUS

## 2015-11-11 MED ORDER — CEFAZOLIN SODIUM-DEXTROSE 2-4 GM/100ML-% IV SOLN
2.0000 g | Freq: Four times a day (QID) | INTRAVENOUS | Status: AC
  Administered 2015-11-11 (×2): 2 g via INTRAVENOUS
  Filled 2015-11-11 (×2): qty 100

## 2015-11-11 MED ORDER — ONDANSETRON HCL 4 MG/2ML IJ SOLN
4.0000 mg | Freq: Four times a day (QID) | INTRAMUSCULAR | Status: DC | PRN
Start: 2015-11-11 — End: 2015-11-13
  Administered 2015-11-11 – 2015-11-12 (×2): 4 mg via INTRAVENOUS
  Filled 2015-11-11 (×2): qty 2

## 2015-11-11 MED ORDER — LIDOCAINE 2% (20 MG/ML) 5 ML SYRINGE
INTRAMUSCULAR | Status: AC
  Filled 2015-11-11: qty 5

## 2015-11-11 MED ORDER — SUFENTANIL CITRATE 50 MCG/ML IV SOLN
INTRAVENOUS | Status: DC | PRN
  Administered 2015-11-11 (×2): 10 ug via INTRAVENOUS
  Administered 2015-11-11: 20 ug via INTRAVENOUS
  Administered 2015-11-11: 10 ug via INTRAVENOUS

## 2015-11-11 MED ORDER — FENTANYL CITRATE (PF) 100 MCG/2ML IJ SOLN
25.0000 ug | INTRAMUSCULAR | Status: DC | PRN
  Administered 2015-11-11 (×2): 50 ug via INTRAVENOUS

## 2015-11-11 MED ORDER — FENTANYL CITRATE (PF) 100 MCG/2ML IJ SOLN: INTRAMUSCULAR | Status: DC | PRN

## 2015-11-11 MED ORDER — HYDROCODONE-ACETAMINOPHEN 7.5-325 MG PO TABS
1.0000 | ORAL_TABLET | ORAL | Status: DC
  Administered 2015-11-11 (×3): 2 via ORAL
  Administered 2015-11-12 (×2): 1 via ORAL
  Administered 2015-11-12: 2 via ORAL
  Administered 2015-11-12: 1 via ORAL
  Administered 2015-11-12 – 2015-11-13 (×4): 2 via ORAL
  Filled 2015-11-11 (×7): qty 2
  Filled 2015-11-11: qty 1
  Filled 2015-11-11 (×2): qty 2
  Filled 2015-11-11: qty 1

## 2015-11-11 MED ORDER — ROCURONIUM BROMIDE 10 MG/ML (PF) SYRINGE
PREFILLED_SYRINGE | INTRAVENOUS | Status: AC
  Filled 2015-11-11: qty 10

## 2015-11-11 MED ORDER — SUFENTANIL CITRATE 50 MCG/ML IV SOLN
INTRAVENOUS | Status: AC
  Filled 2015-11-11: qty 1

## 2015-11-11 MED ORDER — STERILE WATER FOR IRRIGATION IR SOLN
Status: DC | PRN
  Administered 2015-11-11: 2000 mL

## 2015-11-11 MED ORDER — ALUM & MAG HYDROXIDE-SIMETH 200-200-20 MG/5ML PO SUSP: 30.0000 mL | ORAL | Status: DC | PRN

## 2015-11-11 MED ORDER — LIDOCAINE HCL (CARDIAC) 20 MG/ML IV SOLN
INTRAVENOUS | Status: DC | PRN
  Administered 2015-11-11: 50 mg via INTRAVENOUS

## 2015-11-11 MED ORDER — ONDANSETRON HCL 4 MG/2ML IJ SOLN
INTRAMUSCULAR | Status: AC
  Filled 2015-11-11: qty 2

## 2015-11-11 MED ORDER — PROMETHAZINE HCL 25 MG/ML IJ SOLN: 6.2500 mg | INTRAMUSCULAR | Status: DC | PRN

## 2015-11-11 MED ORDER — ONDANSETRON HCL 4 MG PO TABS: 4.0000 mg | ORAL_TABLET | Freq: Four times a day (QID) | ORAL | Status: DC | PRN

## 2015-11-11 MED ORDER — DOCUSATE SODIUM 100 MG PO CAPS: 100.0000 mg | ORAL_CAPSULE | Freq: Two times a day (BID) | ORAL | 0 refills | Status: DC

## 2015-11-11 MED ORDER — BISACODYL 10 MG RE SUPP: 10.0000 mg | Freq: Every day | RECTAL | Status: DC | PRN

## 2015-11-11 MED ORDER — METHOCARBAMOL 500 MG PO TABS: 500.0000 mg | ORAL_TABLET | Freq: Four times a day (QID) | ORAL | 0 refills | Status: DC | PRN

## 2015-11-11 MED ORDER — ASPIRIN 81 MG PO CHEW
81.0000 mg | CHEWABLE_TABLET | Freq: Two times a day (BID) | ORAL | Status: DC
  Administered 2015-11-11 – 2015-11-13 (×4): 81 mg via ORAL
  Filled 2015-11-11 (×4): qty 1

## 2015-11-11 MED ORDER — HYDROCODONE-ACETAMINOPHEN 7.5-325 MG PO TABS: 1.0000 | ORAL_TABLET | ORAL | 0 refills | Status: DC | PRN

## 2015-11-11 MED ORDER — HYDROMORPHONE HCL 1 MG/ML IJ SOLN
INTRAMUSCULAR | Status: AC
  Administered 2015-11-11: 1 mg via INTRAVENOUS
  Filled 2015-11-11: qty 1

## 2015-11-11 MED ORDER — HYDROMORPHONE HCL 1 MG/ML IJ SOLN
INTRAMUSCULAR | Status: AC
  Filled 2015-11-11: qty 1

## 2015-11-11 MED ORDER — LACTATED RINGERS IV SOLN
INTRAVENOUS | Status: DC
  Administered 2015-11-11 (×2): via INTRAVENOUS
  Administered 2015-11-11: 1000 mL via INTRAVENOUS
  Administered 2015-11-11: 14:00:00 via INTRAVENOUS

## 2015-11-11 MED ORDER — SODIUM CHLORIDE 0.9 % IV SOLN
1000.0000 mg | INTRAVENOUS | Status: AC
  Administered 2015-11-11: 1000 mg via INTRAVENOUS
  Filled 2015-11-11: qty 1100

## 2015-11-11 MED ORDER — FENTANYL CITRATE (PF) 100 MCG/2ML IJ SOLN
25.0000 ug | INTRAMUSCULAR | Status: DC | PRN
  Administered 2015-11-11 (×3): 50 ug via INTRAVENOUS

## 2015-11-11 MED ORDER — SODIUM CHLORIDE 0.9 % IV SOLN
100.0000 mL/h | INTRAVENOUS | Status: DC
  Administered 2015-11-12 (×2): 100 mL/h via INTRAVENOUS
  Filled 2015-11-11 (×6): qty 1000

## 2015-11-11 MED ORDER — POLYETHYLENE GLYCOL 3350 17 G PO PACK
17.0000 g | PACK | Freq: Two times a day (BID) | ORAL | Status: DC
  Administered 2015-11-12: 17 g via ORAL
  Filled 2015-11-11 (×2): qty 1

## 2015-11-11 MED ORDER — DEXAMETHASONE SODIUM PHOSPHATE 10 MG/ML IJ SOLN
INTRAMUSCULAR | Status: DC | PRN
  Administered 2015-11-11: 10 mg via INTRAVENOUS

## 2015-11-11 MED ORDER — SUGAMMADEX SODIUM 500 MG/5ML IV SOLN
INTRAVENOUS | Status: AC
  Filled 2015-11-11: qty 5

## 2015-11-11 MED ORDER — EPHEDRINE 5 MG/ML INJ
INTRAVENOUS | Status: AC
  Filled 2015-11-11: qty 10

## 2015-11-11 MED ORDER — DEXAMETHASONE SODIUM PHOSPHATE 10 MG/ML IJ SOLN: 10.0000 mg | Freq: Once | INTRAMUSCULAR | Status: DC

## 2015-11-11 MED ORDER — PHENOL 1.4 % MT LIQD
1.0000 | OROMUCOSAL | Status: DC | PRN
  Filled 2015-11-11: qty 177

## 2015-11-11 MED ORDER — CEFAZOLIN SODIUM-DEXTROSE 2-4 GM/100ML-% IV SOLN
INTRAVENOUS | Status: AC
Start: 2015-11-11 — End: 2015-11-11
  Filled 2015-11-11: qty 100

## 2015-11-11 MED ORDER — HYDROMORPHONE HCL 1 MG/ML IJ SOLN
0.5000 mg | INTRAMUSCULAR | Status: DC | PRN
  Administered 2015-11-11 – 2015-11-12 (×2): 1 mg via INTRAVENOUS
  Filled 2015-11-11 (×2): qty 1

## 2015-11-11 MED ORDER — ROCURONIUM BROMIDE 10 MG/ML (PF) SYRINGE
PREFILLED_SYRINGE | INTRAVENOUS | Status: DC | PRN
  Administered 2015-11-11: 40 mg via INTRAVENOUS
  Administered 2015-11-11: 20 mg via INTRAVENOUS
  Administered 2015-11-11: 10 mg via INTRAVENOUS

## 2015-11-11 MED ORDER — CEFAZOLIN SODIUM-DEXTROSE 2-4 GM/100ML-% IV SOLN
2.0000 g | INTRAVENOUS | Status: AC
  Administered 2015-11-11: 2 g via INTRAVENOUS

## 2015-11-11 MED ORDER — METOCLOPRAMIDE HCL 5 MG PO TABS: 5.0000 mg | ORAL_TABLET | Freq: Three times a day (TID) | ORAL | Status: DC | PRN

## 2015-11-11 MED ORDER — FERROUS SULFATE 325 (65 FE) MG PO TABS: 325.0000 mg | ORAL_TABLET | Freq: Three times a day (TID) | ORAL | 3 refills | Status: DC

## 2015-11-11 MED ORDER — ASPIRIN 81 MG PO CHEW: 81.0000 mg | CHEWABLE_TABLET | Freq: Two times a day (BID) | ORAL | 0 refills | Status: AC

## 2015-11-11 MED ORDER — HYDROMORPHONE HCL 1 MG/ML IJ SOLN
0.2500 mg | INTRAMUSCULAR | Status: DC | PRN
  Administered 2015-11-11 (×4): 0.5 mg via INTRAVENOUS

## 2015-11-11 MED ORDER — DIPHENHYDRAMINE HCL 25 MG PO CAPS: 25.0000 mg | ORAL_CAPSULE | Freq: Four times a day (QID) | ORAL | Status: DC | PRN

## 2015-11-11 SURGICAL SUPPLY — 34 items
BAG SPEC THK2 15X12 ZIP CLS (MISCELLANEOUS) ×1
BAG ZIPLOCK 12X15 (MISCELLANEOUS) ×1 IMPLANT
CAPT HIP TOTAL 2 ×1 IMPLANT
CLOTH BEACON ORANGE TIMEOUT ST (SAFETY) ×2 IMPLANT
COVER PERINEAL POST (MISCELLANEOUS) ×2 IMPLANT
DRAPE STERI IOBAN 125X83 (DRAPES) ×2 IMPLANT
DRAPE U-SHAPE 47X51 STRL (DRAPES) ×4 IMPLANT
DRESSING AQUACEL AG SP 3.5X10 (GAUZE/BANDAGES/DRESSINGS) ×1 IMPLANT
DRSG AQUACEL AG SP 3.5X10 (GAUZE/BANDAGES/DRESSINGS) ×2
DURAPREP 26ML APPLICATOR (WOUND CARE) ×2 IMPLANT
ELECT REM PT RETURN 9FT ADLT (ELECTROSURGICAL) ×2
ELECTRODE REM PT RTRN 9FT ADLT (ELECTROSURGICAL) ×1 IMPLANT
GLOVE BIOGEL PI IND STRL 7.5 (GLOVE) ×1 IMPLANT
GLOVE BIOGEL PI IND STRL 8 (GLOVE) IMPLANT
GLOVE BIOGEL PI INDICATOR 7.5 (GLOVE) ×5
GLOVE BIOGEL PI INDICATOR 8 (GLOVE) ×1
GLOVE ECLIPSE 8.0 STRL XLNG CF (GLOVE) ×4 IMPLANT
GLOVE ORTHO TXT STRL SZ7.5 (GLOVE) ×2 IMPLANT
GLOVE SURG SS PI 7.0 STRL IVOR (GLOVE) ×1 IMPLANT
GLOVE SURG SS PI 7.5 STRL IVOR (GLOVE) ×1 IMPLANT
GOWN STRL REUS W/ TWL XL LVL3 (GOWN DISPOSABLE) IMPLANT
GOWN STRL REUS W/TWL LRG LVL3 (GOWN DISPOSABLE) ×2 IMPLANT
GOWN STRL REUS W/TWL XL LVL3 (GOWN DISPOSABLE) ×5 IMPLANT
HOLDER FOLEY CATH W/STRAP (MISCELLANEOUS) ×2 IMPLANT
LIQUID BAND (GAUZE/BANDAGES/DRESSINGS) ×2 IMPLANT
PACK ANTERIOR HIP CUSTOM (KITS) ×2 IMPLANT
SAW OSC TIP CART 19.5X105X1.3 (SAW) ×2 IMPLANT
SUT MNCRL AB 4-0 PS2 18 (SUTURE) ×2 IMPLANT
SUT VIC AB 1 CT1 36 (SUTURE) ×6 IMPLANT
SUT VIC AB 2-0 CT1 27 (SUTURE) ×4
SUT VIC AB 2-0 CT1 TAPERPNT 27 (SUTURE) ×2 IMPLANT
SUT VLOC 180 0 24IN GS25 (SUTURE) ×2 IMPLANT
TRAY FOLEY W/METER SILVER 16FR (SET/KITS/TRAYS/PACK) ×1 IMPLANT
YANKAUER SUCT BULB TIP 10FT TU (MISCELLANEOUS) ×1 IMPLANT

## 2015-11-11 SURGICAL SUPPLY — 2 items
IMMOBILIZER KNEE 20 (SOFTGOODS) ×2
IMMOBILIZER KNEE 20 THIGH 36 (SOFTGOODS) IMPLANT

## 2015-11-11 NOTE — Anesthesia Postprocedure Evaluation (Signed)
Anesthesia Post Note  Patient: Mark Valentine  Procedure(s) Performed: Procedure(s) (LRB): CLOSED REDUCTION HIP (Left)  Patient location during evaluation: PACU Anesthesia Type: General Level of consciousness: awake and alert Pain management: pain level controlled Vital Signs Assessment: post-procedure vital signs reviewed and stable Respiratory status: spontaneous breathing, nonlabored ventilation, respiratory function stable and patient connected to nasal cannula oxygen Cardiovascular status: blood pressure returned to baseline and stable Postop Assessment: no signs of nausea or vomiting Anesthetic complications: no    Last Vitals:  Vitals:   11/11/15 1500 11/11/15 1515  BP: 124/77 111/69  Pulse: 98 97  Resp: 11 12  Temp:  37 C    Last Pain:  Vitals:   11/11/15 1500  TempSrc:   PainSc: 2                  Tiajuana Amass

## 2015-11-11 NOTE — Brief Op Note (Signed)
11/11/2015  2:10 PM  PATIENT:  [name]  60 y.o. male  PRE-OPERATIVE DIAGNOSIS:  dislocated left hip immediately post-op  POST-OPERATIVE DIAGNOSIS:  dislocated left hip immediately post op  PROCEDURE:  Procedure(s): CLOSED REDUCTION HIP (Left)  SURGEON:  Surgeon(s) and Role:    * Paralee Cancel, MD - Primary  PHYSICIAN ASSISTANT: None   ANESTHESIA:   IV sedation  EBL:  Total I/O In: 2510 [I.V.:2400; IV Piggyback:110] Out: 450 [Urine:100; Blood:350]  BLOOD ADMINISTERED:none  DRAINS: none   LOCAL MEDICATIONS USED:  NONE  SPECIMEN:  No Specimen  DISPOSITION OF SPECIMEN:  N/A  COUNTS:  YES  TOURNIQUET:  * No tourniquets in log *  DICTATION: .Other Dictation: Dictation Number [id number]  PLAN OF CARE: Admit to inpatient   PATIENT DISPOSITION:  PACU - hemodynamically stable.   Delay start of Pharmacological VTE agent (>24hrs) due to surgical blood loss or risk of bleeding: no

## 2015-11-11 NOTE — Discharge Instructions (Signed)

## 2015-11-11 NOTE — Anesthesia Procedure Notes (Signed)
Procedure Name: Intubation Date/Time: 11/11/2015 11:06 AM Performed by: Anne Fu Pre-anesthesia Checklist: Patient identified, Emergency Drugs available, Suction available, Patient being monitored and Timeout performed Patient Re-evaluated:Patient Re-evaluated prior to inductionOxygen Delivery Method: Circle system utilized Preoxygenation: Pre-oxygenation with 100% oxygen Intubation Type: IV induction Ventilation: Mask ventilation without difficulty Laryngoscope Size: Mac and 4 Tube type: Oral Tube size: 7.5 mm Number of attempts: 1 Airway Equipment and Method: Stylet Placement Confirmation: ETT inserted through vocal cords under direct vision,  positive ETCO2,  CO2 detector and breath sounds checked- equal and bilateral Secured at: 23 cm Tube secured with: Tape Dental Injury: Teeth and Oropharynx as per pre-operative assessment

## 2015-11-11 NOTE — Transfer of Care (Signed)
Immediate Anesthesia Transfer of Care Note  Patient: Mark Valentine  Procedure(s) Performed: Procedure(s): LEFT TOTAL HIP ARTHROPLASTY ANTERIOR APPROACH (Left)  Patient Location: PACU  Anesthesia Type:General  Level of Consciousness: awake and oriented  Airway & Oxygen Therapy: Patient Spontanous Breathing and Patient connected to face mask oxygen  Post-op Assessment: Report given to RN and Post -op Vital signs reviewed and stable  Post vital signs: Reviewed and stable  Last Vitals:  Vitals:   11/11/15 0824  BP: (!) 146/95  Pulse: 83  Resp: 18  Temp: 36.9 C    Last Pain:  Vitals:   11/11/15 1022  TempSrc:   PainSc: 3       Patients Stated Pain Goal: 4 ([id number])  Complications: No apparent anesthesia complications

## 2015-11-11 NOTE — Anesthesia Postprocedure Evaluation (Signed)
Anesthesia Post Note  Patient: [name]  Procedure(s) Performed: Procedure(s) (LRB): LEFT TOTAL HIP ARTHROPLASTY ANTERIOR APPROACH (Left)  Patient location during evaluation: PACU Anesthesia Type: General Level of consciousness: awake and alert Pain management: pain level controlled Vital Signs Assessment: post-procedure vital signs reviewed and stable Respiratory status: spontaneous breathing, nonlabored ventilation and respiratory function stable Cardiovascular status: blood pressure returned to baseline and stable Postop Assessment: no signs of nausea or vomiting Anesthetic complications: no    Last Vitals:  Vitals:   11/11/15 1445 11/11/15 1450  BP: 121/78   Pulse: 97 96  Resp: 12 12  Temp:      Last Pain:  Vitals:   11/11/15 1445  TempSrc:   PainSc: 5                  Nilda Simmer

## 2015-11-11 NOTE — Interval H&P Note (Signed)
History and Physical Interval Note:  11/11/2015 10:03 AM  Mark Valentine  has presented today for surgery, with the diagnosis of LEFT HIP OA   The various methods of treatment have been discussed with the patient and family. After consideration of risks, benefits and other options for treatment, the patient has consented to  Procedure(s): LEFT TOTAL HIP ARTHROPLASTY ANTERIOR APPROACH (Left) as a surgical intervention .  The patient's history has been reviewed, patient examined, no change in status, stable for surgery.  I have reviewed the patient's chart and labs.  Questions were answered to the patient's satisfaction.     [name]

## 2015-11-11 NOTE — Anesthesia Preprocedure Evaluation (Signed)
Anesthesia Evaluation  Patient identified by MRN, date of birth, ID band Patient awake    Reviewed: Allergy & Precautions, NPO status , Patient's Chart, lab work & pertinent test results  History of Anesthesia Complications Negative for: history of anesthetic complications  Airway Mallampati: II  TM Distance: >3 FB Neck ROM: Full   Comment: Previous grade I view with [employer] [facility] [employer]  (+) Dental Advisory Given   Pulmonary neg pulmonary ROS,    Pulmonary exam normal breath sounds clear to auscultation       Cardiovascular hypertension, (-) angina(-) Past MI, (-) Cardiac Stents and (-) CABG  Rhythm:Regular Rate:Normal     Neuro/Psych negative neurological ROS     GI/Hepatic negative GI ROS, Neg liver ROS,   Endo/Other  negative endocrine ROS  Renal/GU negative Renal ROS     Musculoskeletal  (+) Arthritis ,   Abdominal (+) + obese,   Peds  Hematology negative hematology ROS (+)   Anesthesia Other Findings HLD, h/o testicular and prostate cancer s/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   Dental advisory given  Plan Discussed with: CRNA  Anesthesia Plan Comments:         Anesthesia Quick Evaluation

## 2015-11-11 NOTE — Op Note (Signed)
NAME:  Mark Valentine NO.: [id number]      MEDICAL RECORD NO.: [id number]      FACILITY:  [facility]      PHYSICIAN:  Paralee Cancel D  DATE OF BIRTH:  1955/07/08     DATE OF PROCEDURE:  11/11/2015                                 OPERATIVE REPORT         PREOPERATIVE DIAGNOSIS: Left  hip osteoarthritis.      POSTOPERATIVE DIAGNOSIS:  Left hip osteoarthritis.      PROCEDURE:  Left total hip replacement through an anterior approach   utilizing DePuy THR system, component size 15mm pinnacle cup, a size 36+4 neutral   Altrex liner, a size 5 Hi Tri Lock stem with a 36+5 delta ceramic   ball.      SURGEON:  Pietro Cassis. Alvan Dame, M.D.      ASSISTANT:  Molli Barrows, PA-C     ANESTHESIA:  General.      SPECIMENS:  None.      COMPLICATIONS:  None.      BLOOD LOSS:  350 cc     DRAINS:  None.      INDICATION OF THE PROCEDURE:  Mark Valentine is a 60 y.o. male who had   presented to office for evaluation of left hip pain.  Radiographs revealed   progressive degenerative changes with bone-on-bone   articulation to the  hip joint.  The patient had painful limited range of   motion significantly affecting their overall quality of life.  The patient was failing to    respond to conservative measures, and at this point was ready   to proceed with more definitive measures.  The patient has noted progressive   degenerative changes in his hip, progressive problems and dysfunction   with regarding the hip prior to surgery.  Consent was obtained for   benefit of pain relief.  Specific risk of infection, DVT, component   failure, dislocation, need for revision surgery, as well discussion of   the anterior versus posterior approach were reviewed.  Consent was   obtained for benefit of anterior pain relief through an anterior   approach.      PROCEDURE IN DETAIL:  The patient was brought to operative theater.   Once adequate anesthesia, preoperative  antibiotics, 2gm of Ancef, 1 gm of Tranexamic Acid, and 10 mg of Decadron administered.   The patient was positioned supine on the OSI Hanna table.  Once adequate   padding of boney process was carried out, we had predraped out the hip, and  used fluoroscopy to confirm orientation of the pelvis and position.      The left hip was then prepped and draped from proximal iliac crest to   mid thigh with shower curtain technique.      Time-out was performed identifying the patient, planned procedure, and   extremity.     An incision was then made 2 cm distal and lateral to the   anterior superior iliac spine extending over the orientation of the   tensor fascia lata muscle and sharp dissection was carried down to the   fascia of the muscle and protractor placed in the soft tissues.      The fascia was  then incised.  The muscle belly was identified and swept   laterally and retractor placed along the superior neck.  Following   cauterization of the circumflex vessels and removing some pericapsular   fat, a second cobra retractor was placed on the inferior neck.  A third   retractor was placed on the anterior acetabulum after elevating the   anterior rectus.  A capsulectomy was performed due to significant adherence of capsule tissue to femoral head and neck.  We then identified the trochanteric fossa and   orientation of my neck cut, confirmed this radiographically   and then made a neck osteotomy with the femur on traction.  The femoral   head was removed without difficulty or complication.  Traction was let   off and retractors were placed posterior and anterior around the   acetabulum.      The labrum and foveal tissue were debrided.  I began reaming with a 32mn   reamer and reamed up to 71mm reamer with good bony bed preparation and a 45mm   cup was chosen.  The final 19mm Pinnacle cup was then impacted under fluoroscopy  to confirm the depth of penetration and orientation with respect to    abduction.  A screw was placed followed by the hole eliminator.  The final   36+4 neutral Altrex liner was impacted with good visualized rim fit.  The cup was positioned anatomically within the acetabular portion of the pelvis.      At this point, the femur was rolled at 80 degrees.  Further capsule was   released off the inferior aspect of the femoral neck.  I then   released the superior capsule proximally.  The hook was placed laterally   along the femur and elevated manually and held in position with the bed   hook.  The leg was then extended and adducted with the leg rolled to 100   degrees of external rotation.  Once the proximal femur was fully   exposed, I used a box osteotome to set orientation.  I then began   broaching with the starting chili pepper broach and passed this by hand and then broached up to 5 to match the other side.  With the 5 broach in place I chose a high offset neck and did several trial reductions.  The offset was appropriate, leg lengths   appeared to be equal, confirmed radiographically.   Given these findings, I went ahead and dislocated the hip, repositioned all   retractors and positioned the right hip in the extended and abducted position.  The final 5 Hi Tri Lock stem was   chosen and it was impacted down to the level of neck cut.  Based on this   and the trial reduction, a 36+5 delta ceramic ball was chosen and   impacted onto a clean and dry trunnion, and the hip was reduced.  The   hip had been irrigated throughout the case again at this point.  The fascia of the   tensor fascia lata muscle was then reapproximated using #1 Vicryl and #0 V-lock sutures.  The   remaining wound was closed with 2-0 Vicryl and running 4-0 Monocryl.   The hip was cleaned, dried, and dressed sterilely using Dermabond and   Aquacel dressing.  He was then brought   to recovery room in stable condition tolerating the procedure well.    Molli Barrows, PA-C was present for the  entirety of the case involved from  preoperative positioning, perioperative retractor management, general   facilitation of the case, as well as primary wound closure as assistant.            Pietro Cassis Alvan Dame, M.D.        11/11/2015 12:28 PM

## 2015-11-11 NOTE — Progress Notes (Signed)
Oral airway removed upon arrival to pacu 

## 2015-11-11 NOTE — Progress Notes (Signed)
Xray shows hip replacement is not intact. Dr Alvan Dame notified.

## 2015-11-11 NOTE — Progress Notes (Signed)
Dr Alvan Dame at bedside.

## 2015-11-11 NOTE — Progress Notes (Signed)
Family at bedside. Wife signed consent for pt to return to surgery.

## 2015-11-11 NOTE — Transfer of Care (Signed)
Immediate Anesthesia Transfer of Care Note  Patient: Mark Valentine  Procedure(s) Performed: Procedure(s): CLOSED REDUCTION HIP (Left)  Patient Location: PACU  Anesthesia Type:General  Level of Consciousness:  sedated, patient cooperative and responds to stimulation  Airway & Oxygen Therapy:Patient Spontanous Breathing and Patient connected to face mask oxgen  Post-op Assessment:  Report given to PACU RN and Post -op Vital signs reviewed and stable  Post vital signs:  Reviewed and stable  Last Vitals:  Vitals:   11/11/15 1343 11/11/15 1400  BP:  110/69  Pulse: 99 97  Resp: 11 11  Temp:      Complications: No apparent anesthesia complications

## 2015-11-11 NOTE — Progress Notes (Signed)
Pt taken back to OR per CRNA

## 2015-11-12 LAB — CBC
HCT: 34.5 % — ABNORMAL LOW (ref 39.0–52.0)
HEMOGLOBIN: 11.5 g/dL — AB (ref 13.0–17.0)
MCH: 29.9 pg (ref 26.0–34.0)
MCHC: 33.3 g/dL (ref 30.0–36.0)
MCV: 89.8 fL (ref 78.0–100.0)
Platelets: 168 10*3/uL (ref 150–400)
RBC: 3.84 MIL/uL — AB (ref 4.22–5.81)
RDW: 13.6 % (ref 11.5–15.5)
WBC: 8.8 10*3/uL (ref 4.0–10.5)

## 2015-11-12 LAB — BASIC METABOLIC PANEL
ANION GAP: 6 (ref 5–15)
BUN: 13 mg/dL (ref 6–20)
CHLORIDE: 104 mmol/L (ref 101–111)
CO2: 27 mmol/L (ref 22–32)
CREATININE: 1.11 mg/dL (ref 0.61–1.24)
Calcium: 8.6 mg/dL — ABNORMAL LOW (ref 8.9–10.3)
GFR calc non Af Amer: >60 mL/min (ref >60)
Glucose, Bld: 128 mg/dL — ABNORMAL HIGH (ref 65–99)
POTASSIUM: 4.9 mmol/L (ref 3.5–5.1)
SODIUM: 137 mmol/L (ref 135–145)

## 2015-11-12 NOTE — Progress Notes (Signed)
     Subjective: 1 Day Post-Op Procedure(s) (LRB): CLOSED REDUCTION HIP (Left)   Patient reports pain as moderate-severe, controlled with medication.  No events throughout the night.  Feels that he had difficulty getting out of the bed last night with KI in place.  Concerned with regards to the hip.    Objective:   VITALS:   Vitals:   11/12/15 0257 11/12/15 0600  BP: 101/63 (!) 97/59  Pulse: 75 70  Resp: 16 16  Temp: 98.1 F (36.7 C) 98.6 F (37 C)    Dorsiflexion/Plantar flexion intact Incision: dressing C/D/I No cellulitis present Compartment soft  LABS  Recent Labs  11/12/15 0441  HGB 11.5*  HCT 34.5*  WBC 8.8  PLT 168     Recent Labs  11/12/15 0441  NA 137  K 4.9  BUN 13  CREATININE 1.11  GLUCOSE 128*     Assessment/Plan: 1 Day Post-Op Procedure(s) (LRB): CLOSED REDUCTION HIP (Left) Foley cath d/c'ed Anterior hip precautions May come out of the [facility] per Dr. Alvan Dame. Advance diet Up with therapy D/C IV fluids Discharge home with home health eventually, when ready  Obese (BMI 30-39.9) Estimated body mass index is 33.97 kg/m as calculated from the following:   Height as of this encounter: 5\' 9"  (1.753 m).   Weight as of this encounter: 104.3 kg (230 lb). Patient also counseled that weight may inhibit the healing process Patient counseled that losing weight will help with future health issues       West Pugh. Andalyn Heckstall   PAC  11/12/2015, 8:37 AM

## 2015-11-12 NOTE — Progress Notes (Signed)
Physical Therapy Treatment Patient Details Name: Mark Valentine MRN: [id number] DOB: [birth year] Today's Date: 11/12/2015    History of Present Illness s/p L DA THA with anterior THPs due to dislocation. H/o R DA THA    PT Comments    Pt assisted back to bed, reviewed ant THP and car transfers  Follow Up Recommendations  Home health PT     Equipment Recommendations  None recommended by PT    Recommendations for Other Services OT consult     Precautions / Restrictions Precautions Precautions: Anterior Hip Precaution Booklet Issued: Yes (comment) Precaution Comments: Reviewed x 2 Restrictions Weight Bearing Restrictions: No Other Position/Activity Restrictions: WBAT    Mobility  Bed Mobility Overal bed mobility: Needs Assistance Bed Mobility: Sit to Supine     Supine to sit: Min assist;Mod assist Sit to supine: Min assist;Mod assist   General bed mobility comments: cues for sequence, adherence to ant THP, and use of R LE to self assist  Transfers Overall transfer level: Needs assistance Equipment used: Rolling walker (2 wheeled) Transfers: Sit to/from Stand Sit to Stand: Min assist         General transfer comment: cues for UE/LE placement and assist to extend LLE when sitting  Ambulation/Gait Ambulation/Gait assistance: Min assist Ambulation Distance (Feet): 6 Feet Assistive device: Rolling walker (2 wheeled) Gait Pattern/deviations: Step-to pattern;Decreased step length - right;Decreased step length - left;Shuffle;Trunk flexed Gait velocity: decr Gait velocity interpretation: Below normal speed for age/gender General Gait Details: cues for sequence, posture and position from [employer]            Wheelchair Mobility    Modified Rankin (Stroke Patients Only)       Balance                                    Cognition Arousal/Alertness: Awake/alert Behavior During Therapy: WFL for tasks assessed/performed Overall  Cognitive Status: Within Functional Limits for tasks assessed                      Exercises Total Joint Exercises Ankle Circles/Pumps: AROM;15 reps;Supine;Both Quad Sets: AROM;Both;10 reps;Supine Heel Slides: AAROM;Left;15 reps;Supine    General Comments        Pertinent Vitals/Pain Pain Assessment: 0-10 Pain Score: 4  Faces Pain Scale: Hurts little more Pain Location: L hip Pain Descriptors / Indicators: Aching;Sore Pain Intervention(s): Limited activity within patient's tolerance;Monitored during session;Premedicated before session;Ice applied    Home Living Family/patient expects to be discharged to:: Private residence Living Arrangements: Spouse/significant other Available Help at Discharge: Family Type of Home: House Home Access: Stairs to enter Entrance Stairs-Rails: Right Home Layout: One level Home Equipment: Environmental consultant - 2 wheels;Bedside commode      Prior Function Level of Independence: Independent      Comments: occasional A with LB dressing by wife   PT Goals (current goals can now be found in the care plan section) Acute Rehab PT Goals Patient Stated Goal: return to independence PT Goal Formulation: With patient Time For Goal Achievement: 11/15/15 Potential to Achieve Goals: Good Progress towards PT goals: Progressing toward goals    Frequency  7X/week    PT Plan Current plan remains appropriate    Co-evaluation PT/OT/SLP Co-Evaluation/Treatment: Yes Reason for Co-Treatment: For patient/therapist safety PT goals addressed during session: Mobility/safety with mobility OT goals addressed during session: ADL's and self-care     End  of Session Equipment Utilized During Treatment: Gait belt Activity Tolerance: Patient tolerated treatment well Patient left: in bed;with call bell/phone within reach     Time: 1124-1140 PT Time Calculation (min) (ACUTE ONLY): 16 min  Charges:  $Gait Training: 8-22 mins $Therapeutic Activity: 8-22 mins                     G Codes:      Jorge Retz 12/06/2015, 12:35 PM

## 2015-11-12 NOTE — Evaluation (Signed)
Occupational Therapy Evaluation Patient Details Name: Mark Valentine MRN: [id number] DOB: 04-21-1955 Today's Date: 11/12/2015    History of Present Illness s/p L DA THA with anterior THPs due to dislocation. H/o R DA THA   Clinical Impression   This 59 year old man was admitted for the above sx.  All education was completed. No further OT is needed at this time    Follow Up Recommendations  No OT follow up;Supervision/Assistance - 24 hour    Equipment Recommendations  None recommended by OT    Recommendations for Other Services       Precautions / Restrictions Precautions Precautions: Anterior Hip Restrictions Weight Bearing Restrictions: No Other Position/Activity Restrictions: WBAT      Mobility Bed Mobility               General bed mobility comments: performed by PT  Transfers Overall transfer level: Needs assistance Equipment used: Rolling walker (2 wheeled) Transfers: Sit to/from Stand Sit to Stand: Min assist         General transfer comment: cues for UE/LE placement and assist to extend LLE when sitting    Balance                                            ADL Overall ADL's : Needs assistance/impaired                         Toilet Transfer: Minimal assistance;Ambulation;RW (chair)             General ADL Comments: wife will assist with adls at home as she did after last sx. Educated on anterior hip precautions related to adls and toilet/tub transfers.  Pt will sponge bathe again.  Pt is aware of precautions and he self-cues     Vision     Perception     Praxis      Pertinent Vitals/Pain Pain Assessment: Faces Faces Pain Scale: Hurts little more Pain Location: L hip Pain Descriptors / Indicators: Aching Pain Intervention(s): Limited activity within patient's tolerance;Monitored during session;Premedicated before session;Repositioned;Ice applied     Hand Dominance     Extremity/Trunk  Assessment Upper Extremity Assessment Upper Extremity Assessment: Overall WFL for tasks assessed (c/o weakness R elbow due to previous fall)           Communication Communication Communication: No difficulties   Cognition Arousal/Alertness: Awake/alert Behavior During Therapy: WFL for tasks assessed/performed Overall Cognitive Status: Within Functional Limits for tasks assessed                     General Comments       Exercises       Shoulder Instructions      Home Living Family/patient expects to be discharged to:: Private residence Living Arrangements: Spouse/significant other Available Help at Discharge: Family               Bathroom Shower/Tub: Tub/shower unit Shower/tub characteristics: Curtain Biochemist, clinical: Standard     Home Equipment: Environmental consultant - 2 wheels;Bedside commode          Prior Functioning/Environment Level of Independence: Independent             OT Diagnosis: Acute pain   OT Problem List:     OT Treatment/Interventions:      OT Goals(Current goals can be found in the  care plan section) Acute Rehab OT Goals Patient Stated Goal: return to independence OT Goal Formulation: All assessment and education complete, DC therapy  OT Frequency:     Barriers to D/C:            Co-evaluation PT/OT/SLP Co-Evaluation/Treatment: Yes Reason for Co-Treatment: For patient/therapist safety PT goals addressed during session: Mobility/safety with mobility OT goals addressed during session: ADL's and self-care      End of Session    Activity Tolerance: Patient tolerated treatment well Patient left: in chair;with call bell/phone within reach;with chair alarm set;with family/visitor present   Time: [id number] OT Time Calculation (min): 21 min Charges:  OT General Charges $OT Visit: 1 Procedure OT Evaluation $OT Eval Low Complexity: 1 Procedure G-Codes:    Lavonne Kinderman Nov 15, 2015, 9:30 AM  Lesle Chris,  OTR/L [phone] 11-15-15

## 2015-11-12 NOTE — Progress Notes (Signed)
Pt not able to manage to dangle comfortably with 2 assist. Pt with some increasing discomfort later in the shift. Prn pain med given (see mar). Pt declined to have foley removed at this time.   Will f/u with Dayshift nurse

## 2015-11-12 NOTE — Progress Notes (Signed)
Physical Therapy Treatment Patient Details Name: Mark Valentine MRN: [id number] DOB: Apr 17, 1955 Today's Date: 11/12/2015    History of Present Illness s/p L DA THA with anterior THPs due to dislocation. H/o R DA THA    PT Comments    Pt progressing well with mobility and hopeful for dc home tomorrow.  Follow Up Recommendations  Home health PT     Equipment Recommendations  None recommended by PT    Recommendations for Other Services OT consult     Precautions / Restrictions Precautions Precautions: Anterior Hip Precaution Booklet Issued: Yes (comment) Precaution Comments: Reviewed x 2 Restrictions Weight Bearing Restrictions: No Other Position/Activity Restrictions: WBAT    Mobility  Bed Mobility Overal bed mobility: Needs Assistance Bed Mobility: Supine to Sit;Sit to Supine     Supine to sit: Min assist Sit to supine: Min assist   General bed mobility comments: cues for sequence, adherence to ant THP, and use of R LE to self assist  Transfers Overall transfer level: Needs assistance Equipment used: Rolling walker (2 wheeled) Transfers: Sit to/from Stand Sit to Stand: Min guard;From elevated surface         General transfer comment: cues for UE/LE placement and assist to extend LLE when sitting  Ambulation/Gait Ambulation/Gait assistance: Min guard Ambulation Distance (Feet): 150 Feet Assistive device: Rolling walker (2 wheeled) Gait Pattern/deviations: Step-to pattern;Step-through pattern;Decreased step length - right;Decreased step length - left;Shuffle;Trunk flexed Gait velocity: decr Gait velocity interpretation: Below normal speed for age/gender General Gait Details: cues for sequence, posture and position from [employer]            Wheelchair Mobility    Modified Rankin (Stroke Patients Only)       Balance                                    Cognition Arousal/Alertness: Awake/alert Behavior During Therapy: WFL for  tasks assessed/performed Overall Cognitive Status: Within Functional Limits for tasks assessed                      Exercises Total Joint Exercises Ankle Circles/Pumps: AROM;15 reps;Supine;Both Quad Sets: AROM;Both;10 reps;Supine Heel Slides: AAROM;Left;15 reps;Supine    General Comments        Pertinent Vitals/Pain Pain Assessment: 0-10 Pain Score: 4  Pain Location: L hip Pain Descriptors / Indicators: Aching;Sore Pain Intervention(s): Limited activity within patient's tolerance;Monitored during session;Ice applied;Patient requesting pain meds-RN notified    Home Living                      Prior Function            PT Goals (current goals can now be found in the care plan section) Acute Rehab PT Goals Patient Stated Goal: return to independence PT Goal Formulation: With patient Time For Goal Achievement: 11/15/15 Potential to Achieve Goals: Good Progress towards PT goals: Progressing toward goals    Frequency  7X/week    PT Plan Current plan remains appropriate    Co-evaluation             End of Session Equipment Utilized During Treatment: Gait belt Activity Tolerance: Patient tolerated treatment well Patient left: in bed;with call bell/phone within reach     Time: 1555-1625 PT Time Calculation (min) (ACUTE ONLY): 30 min  Charges:  $Gait Training: 8-22 mins $Therapeutic Exercise: 8-22 mins  G Codes:      [place] Hanlon 24-Nov-2015, 4:49 PM

## 2015-11-12 NOTE — Evaluation (Signed)
Physical Therapy Evaluation Patient Details Name: Mark Valentine MRN: [id number] DOB: 04-Jul-1955 Today's Date: 11/12/2015   History of Present Illness  s/p L DA THA with anterior THPs due to dislocation. H/o R DA THA  Clinical Impression  Pt s/p L THR presents with decreased L LE strength/ROM, post op pain, and ant THP limiting functional mobility.  Pt should progress to dc home with family assist and HHPT follow up.    Follow Up Recommendations Home health PT    Equipment Recommendations  None recommended by PT    Recommendations for Other Services OT consult     Precautions / Restrictions Precautions Precautions: Anterior Hip Restrictions Weight Bearing Restrictions: No Other Position/Activity Restrictions: WBAT      Mobility  Bed Mobility Overal bed mobility: Needs Assistance Bed Mobility: Supine to Sit     Supine to sit: Min assist;Mod assist     General bed mobility comments: cues for sequence, adherence to ant THP, and use of R LE to self assist  Transfers Overall transfer level: Needs assistance Equipment used: Rolling walker (2 wheeled) Transfers: Sit to/from Stand Sit to Stand: Min assist         General transfer comment: cues for UE/LE placement and assist to extend LLE when sitting  Ambulation/Gait Ambulation/Gait assistance: Min assist Ambulation Distance (Feet): 90 Feet Assistive device: Rolling walker (2 wheeled) Gait Pattern/deviations: Step-to pattern;Decreased step length - right;Decreased step length - left;Shuffle;Trunk flexed Gait velocity: decr Gait velocity interpretation: Below normal speed for age/gender General Gait Details: cues for sequence, posture and position from [employer]            Wheelchair Mobility    Modified Rankin (Stroke Patients Only)       Balance                                             Pertinent Vitals/Pain Pain Assessment: 0-10 Pain Score: 4  Faces Pain Scale: Hurts  little more Pain Location: L hip Pain Descriptors / Indicators: Aching;Sore Pain Intervention(s): Limited activity within patient's tolerance;Monitored during session;Premedicated before session;Ice applied    Home Living Family/patient expects to be discharged to:: Private residence Living Arrangements: Spouse/significant other Available Help at Discharge: Family Type of Home: House Home Access: Stairs to enter Entrance Stairs-Rails: Right Entrance Stairs-Number of Steps: 3 Home Layout: One level Home Equipment: Environmental consultant - 2 wheels;Bedside commode      Prior Function Level of Independence: Independent         Comments: occasional A with LB dressing by wife     Hand Dominance        Extremity/Trunk Assessment   Upper Extremity Assessment: Overall WFL for tasks assessed           Lower Extremity Assessment: LLE deficits/detail   LLE Deficits / Details: Strength at hip 2/5 with AAROM to 70 hip flex.  Abd not tested 2* ant THP  Cervical / Trunk Assessment: Normal  Communication   Communication: No difficulties  Cognition Arousal/Alertness: Awake/alert Behavior During Therapy: WFL for tasks assessed/performed Overall Cognitive Status: Within Functional Limits for tasks assessed                      General Comments      Exercises Total Joint Exercises Ankle Circles/Pumps: AROM;15 reps;Supine;Both Quad Sets: AROM;Both;10 reps;Supine Heel Slides: AAROM;Left;15 reps;Supine  Assessment/Plan    PT Assessment Patient needs continued PT services  PT Diagnosis Difficulty walking   PT Problem List Decreased strength;Decreased range of motion;Decreased activity tolerance;Decreased mobility;Pain;Decreased knowledge of use of DME  PT Treatment Interventions DME instruction;Gait training;Stair training;Functional mobility training;Therapeutic exercise;Therapeutic activities;Patient/family education   PT Goals (Current goals can be found in the Care Plan  section) Acute Rehab PT Goals Patient Stated Goal: return to independence PT Goal Formulation: With patient Time For Goal Achievement: 11/15/15 Potential to Achieve Goals: Good    Frequency 7X/week   Barriers to discharge        Co-evaluation PT/OT/SLP Co-Evaluation/Treatment: Yes Reason for Co-Treatment: For patient/therapist safety PT goals addressed during session: Mobility/safety with mobility OT goals addressed during session: ADL's and self-care       End of Session Equipment Utilized During Treatment: Gait belt Activity Tolerance: Patient tolerated treatment well Patient left: in chair;with call bell/phone within reach Nurse Communication: Mobility status         Time: [id number] PT Time Calculation (min) (ACUTE ONLY): 43 min   Charges:   PT Evaluation $PT Eval Low Complexity: 1 Procedure PT Treatments $Gait Training: 8-22 mins   PT G Codes:        Davontay [place] 15-Nov-2015, 12:26 PM

## 2015-11-12 NOTE — Care Management Note (Signed)
Case Management Note  Patient Details  Name: [name] MRN: [id number] Date of Birth: [birth year]  Subjective/Objective:                  CLOSED REDUCTION HIP (Left)  Action/Plan: Discharge planning Expected Discharge Date:  11/13/15               Expected Discharge Plan:  [place]  In-House Referral:     Discharge planning Services  CM Consult  Post Acute Care Choice:  Home Health Choice offered to:  Patient  DME Arranged:  N/A DME Agency:  NA  HH Arranged:  PT [place] Agency:  [facility]  Status of Service:  Completed, signed off  If discussed at [employer], dates discussed:    Additional Comments: Cm met with pt in room to offer choice of home health agency.  Pt chooses [facility] [place].  CM notified [facility] rep, Tim with request for [employer].  Pt has DME from previous surgery.  No other CM needs were communicated. Dellie Catholic, RN 11/12/2015, 12:11 PM

## 2015-11-12 NOTE — Op Note (Signed)
NAMEMARCIANO, Mark Valentine NO.:  [id number]  MEDICAL RECORD NO.:  [id number]  LOCATION:  [id number]                         FACILITY:  [facility]  PHYSICIAN:  Pietro Cassis. Alvan Dame, M.D.  DATE OF BIRTH:  [birth year]  DATE OF PROCEDURE:  11/11/2015 DATE OF DISCHARGE:                              OPERATIVE REPORT   PREOPERATIVE DIAGNOSIS:  Left hip dislocation in the immediate postoperative period following left total hip arthroplasty.  POSTOPERATIVE DIAGNOSIS:  Left hip dislocation in the immediate postoperative period following left total hip arthroplasty.  PROCEDURE:  Closed reduction of left total hip under IV sedation.  ANESTHESIA:  IV sedation.  SURGEON:  Pietro Cassis. Alvan Dame, M.D.  ASSISTANT:  Surgical team.  COMPLICATION:  None.  INDICATIONS FOR THE PROCEDURE:  [name] is a very pleasant 60 year old male with advanced bilateral hip osteoarthritis with a history of recent right total hip arthroplasty, scheduled for now a left total hip arthroplasty.  He underwent an uncomplicated and successful-appearing left total hip arthroplasty.  The only findings were the significant osteophytic changes around his hip associated with his advanced degenerative changes.  In the operative room, we did have to lengthen his left lower extremity to match and restore his leg length to match the right lower extremity, which had been previously replaced and restored.  There were no apparent complicating features and no objective findings intraoperatively that were of concern.  The patient was brought to the recovery room postprocedure, woken from general anesthetic.  At some point, transferred from the room to the PACU, he developed increasing pain.  He was noted to have some shortening, external rotation.  Radiographs revealed a dislocated left hip.  He was seen and evaluated and due to his anesthesia and pain medications, we talked to his wife about what needs to be done. Subsequently,  consent was signed by his wife, he came back to the operating room under IV sedation, reduced his hip.  Consent was obtained.  PROCEDURE IN DETAIL:  The patient was brought to the operative theater. Once adequate anesthesia was established, a time-out was performed identifying the planned procedure and extremity.  With traction and internal rotation, I was able to reduce the hip, restoring leg lengths.  I maintained his knee in a neutral position anteriorly and placed into a knee immobilizer.  He did have x-ray in the room for plane portable radiograph to confirm reduction.  Once this was done, he was allowed to wake from his anesthesia and brought to the recovery room in stable condition with his knee immobilizer in place.  At this point, we will be mindful of probably anterior hip precautions with him for while to allow his muscles to redevelop and to provide the gluteal support inherently to stabilize the hip joint.  Otherwise, he will be weightbearing as tolerated.     Pietro Cassis Alvan Dame, M.D.     MDO/MEDQ  D:  11/11/2015  T:  11/12/2015  Job:  [id number]

## 2015-11-13 LAB — BASIC METABOLIC PANEL
Anion gap: 4 — ABNORMAL LOW (ref 5–15)
BUN: 15 mg/dL (ref 6–20)
CHLORIDE: 105 mmol/L (ref 101–111)
CO2: 29 mmol/L (ref 22–32)
CREATININE: 1.06 mg/dL (ref 0.61–1.24)
Calcium: 8.4 mg/dL — ABNORMAL LOW (ref 8.9–10.3)
GFR calc non Af Amer: >60 mL/min (ref >60)
Glucose, Bld: 130 mg/dL — ABNORMAL HIGH (ref 65–99)
Potassium: 4.7 mmol/L (ref 3.5–5.1)
Sodium: 138 mmol/L (ref 135–145)

## 2015-11-13 LAB — CBC
HEMATOCRIT: 32.4 % — AB (ref 39.0–52.0)
HEMOGLOBIN: 10.6 g/dL — AB (ref 13.0–17.0)
MCH: 30.4 pg (ref 26.0–34.0)
MCHC: 32.7 g/dL (ref 30.0–36.0)
MCV: 92.8 fL (ref 78.0–100.0)
Platelets: 168 10*3/uL (ref 150–400)
RBC: 3.49 MIL/uL — AB (ref 4.22–5.81)
RDW: 14.2 % (ref 11.5–15.5)
WBC: 10.4 10*3/uL (ref 4.0–10.5)

## 2015-11-13 NOTE — Progress Notes (Signed)
All DC instructions reviewed with the pt and his wife. All questions and concerns were addressed. Pt is alert and oriented times four, VSS, pain is controlled, pt shows no s/s of distress or discomfort at this time. Pt DC home with wife and HH will follow up.

## 2015-11-13 NOTE — Progress Notes (Signed)
Physical Therapy Treatment Patient Details Name: Mark Valentine MRN: [id number] DOB: 1955-08-29 Today's Date: 11/13/2015    History of Present Illness s/p L DA THA with anterior THPs due to dislocation. H/o R DA THA    PT Comments    Pt progressing well and eager for return home.  Reviewed therex, stairs and car transfers with pt and spouse  Follow Up Recommendations  Home health PT     Equipment Recommendations  None recommended by PT    Recommendations for Other Services OT consult     Precautions / Restrictions Precautions Precautions: Anterior Hip Precaution Booklet Issued: Yes (comment) Precaution Comments: Pt able to state all THP without cues Restrictions Weight Bearing Restrictions: No Other Position/Activity Restrictions: WBAT    Mobility  Bed Mobility Overal bed mobility: Needs Assistance Bed Mobility: Supine to Sit     Supine to sit: Min assist;Min guard     General bed mobility comments: cues for sequence, adherence to ant THP, and use of R LE to self assist  Transfers Overall transfer level: Needs assistance Equipment used: Rolling walker (2 wheeled) Transfers: Sit to/from Stand Sit to Stand: Supervision;From elevated surface            Ambulation/Gait Ambulation/Gait assistance: Min guard;Supervision Ambulation Distance (Feet): 123 Feet Assistive device: Rolling walker (2 wheeled) Gait Pattern/deviations: Step-to pattern;Step-through pattern;Shuffle;Trunk flexed Gait velocity: decr Gait velocity interpretation: Below normal speed for age/gender     Stairs Stairs: Yes Stairs assistance: Min guard Stair Management: One rail Right;Step to pattern;Forwards;With crutches Number of Stairs: 2 General stair comments: min cues for sequence and foot/crutch placement  Wheelchair Mobility    Modified Rankin (Stroke Patients Only)       Balance                                    Cognition Arousal/Alertness:  Awake/alert Behavior During Therapy: WFL for tasks assessed/performed Overall Cognitive Status: Within Functional Limits for tasks assessed                      Exercises Total Joint Exercises Ankle Circles/Pumps: AROM;15 reps;Supine;Both Quad Sets: AROM;Both;10 reps;Supine Heel Slides: AAROM;Left;Supine;20 reps Long Arc Quad: AAROM;AROM;Left;15 reps;Seated    General Comments        Pertinent Vitals/Pain Pain Assessment: 0-10 Pain Score: 4  Pain Location: L hip Pain Descriptors / Indicators: Aching;Sore Pain Intervention(s): Limited activity within patient's tolerance;Monitored during session;Premedicated before session    Home Living                      Prior Function            PT Goals (current goals can now be found in the care plan section) Acute Rehab PT Goals Patient Stated Goal: return to independence PT Goal Formulation: With patient Time For Goal Achievement: 11/15/15 Potential to Achieve Goals: Good Progress towards PT goals: Progressing toward goals    Frequency  7X/week    PT Plan Current plan remains appropriate    Co-evaluation             End of Session Equipment Utilized During Treatment: Gait belt Activity Tolerance: Patient tolerated treatment well Patient left: Other (comment) (bathroom)     Time: [id number] PT Time Calculation (min) (ACUTE ONLY): 36 min  Charges:  $Gait Training: 8-22 mins $Therapeutic Exercise: 8-22 mins  G Codes:      Sanad Fearnow 2015-11-29, 11:09 AM

## 2015-11-13 NOTE — Progress Notes (Signed)
     Subjective: 2 Days Post-Op Procedure(s) (LRB): CLOSED REDUCTION HIP (Left)   Patient reports pain as mild, pain controlled. No events throughout the night.  Nervous with regards to the potential for dislocating the hip.  We have reviewed the restriction and he is willing to comply.  Ready to be discharged home.  Objective:   VITALS:   Vitals:   11/12/15 2351 11/13/15 0436  BP: 109/70 108/63  Pulse: 76 73  Resp: 18 18  Temp: 98.8 F (37.1 C) 97.7 F (36.5 C)    Dorsiflexion/Plantar flexion intact Incision: dressing C/D/I No cellulitis present Compartment soft  LABS  Recent Labs  11/12/15 0441 11/13/15 0409  HGB 11.5* 10.6*  HCT 34.5* 32.4*  WBC 8.8 10.4  PLT 168 168     Recent Labs  11/12/15 0441 11/13/15 0409  NA 137 138  K 4.9 4.7  BUN 13 15  CREATININE 1.11 1.06  GLUCOSE 128* 130*     Assessment/Plan: 2 Days Post-Op Procedure(s) (LRB): CLOSED REDUCTION HIP (Left) Up with therapy Discharge home with home health  Follow up in 2 weeks at [facility]. Follow up with OLIN,Shedrick Sarli D in 2 weeks.  Contact information:  [facility] [address] [place] Shainna Faux   PAC  11/13/2015, 8:38 AM

## 2015-11-17 NOTE — Discharge Summary (Signed)
Physician Discharge Summary  Patient ID: Mark Valentine MRN: [id number] DOB/AGE: 1955/11/18 60 y.o.  Admit date: 11/11/2015 Discharge date: 11/13/2015   Procedures:  Procedure(s) (LRB): CLOSED REDUCTION HIP (Left)  Attending Physician:  Dr. Paralee Cancel   Admission Diagnoses:   Left hip primary OA / pain  Discharge Diagnoses:  Principal Problem:   S/P left THA, AA Active Problems:   Obesity  Past Medical History:  Diagnosis Date  . Anal fissure 1980  . Hip pain, bilateral    pending surgery 09/2015, [facility]  . History of testicular cancer   . Hypertension 09/24/2015   no meds currently-elevates with MD visits.  . Lipoprotein deficiencies   . Low HDL (under 40)   . Osteoarthritis of hip   . Prostate cancer [facility]) 2017   robotic prostatectomy 06-04-15  . Wears glasses     HPI:    Mark Valentine, 60 y.o. male, has a history of pain and functional disability in the left hip(s) due to arthritis and patient has failed non-surgical conservative treatments for greater than 12 weeks to include NSAID's and/or analgesics and activity modification.  Onset of symptoms was gradual starting 15+ years ago with gradually worsening course since that time.The patient noted prior procedures of the hip to include arthroplasty on the right hip(s).  Patient currently rates pain in the left hip at 8 out of 10 with activity. Patient has night pain, worsening of pain with activity and weight bearing, trendelenberg gait, pain that interfers with activities of daily living and pain with passive range of motion. Patient has evidence of periarticular osteophytes and joint space narrowing by imaging studies. This condition presents safety issues increasing the risk of falls. There is no current active infection.   Risks, benefits and expectations were discussed with the patient.  Risks including but not limited to the risk of anesthesia, blood clots, nerve damage, blood vessel damage, failure of the  prosthesis, infection and up to and including death.  Patient understand the risks, benefits and expectations and wishes to proceed with surgery.  PCP: Crisoforo Oxford, PA-C   Discharged Condition: good  Hospital Course:  Patient underwent the above stated procedure on 11/11/2015. Patient tolerated the procedure well and brought to the recovery room in good condition and subsequently to the floor.  POD #1 BP: 97/59 ; Pulse: 70 ; Temp: 98.6 F (37 C) ; Resp: 16 Patient reports pain as moderate-severe, controlled with medication.  No events throughout the night.  Feels that he had difficulty getting out of the bed last night with KI in place.  Concerned with regards to the hip.  Dorsiflexion/plantar flexion intact, incision: dressing C/D/I, no cellulitis present and compartment soft.   LABS  Basename    HGB     11.5  HCT     34.5   POD #2  BP: 108/63 ; Pulse: 73 ; Temp: 97.7 F (36.5 C) ; Resp: 18 Patient reports pain as mild, pain controlled. No events throughout the night.  Nervous with regards to the potential for dislocating the hip.  We have reviewed the restriction and he is willing to comply.  Ready to be discharged home. Dorsiflexion/plantar flexion intact, incision: dressing C/D/I, no cellulitis present and compartment soft.   LABS  Basename    HGB     10.9  HCT     32.4     Discharge Exam: General appearance: alert, cooperative and no distress Extremities: Homans sign is negative, no sign of  DVT, no edema, redness or tenderness in the calves or thighs and no ulcers, gangrene or trophic changes  Disposition: Home with follow up in 2 weeks   Follow-up Information    [name], MD. Schedule an appointment as soon as possible for a visit in 2 week(s).   Specialty:  Orthopedic Surgery Contact information: [address] [place] [address] [phone]        [facility] .   Why:  now known as [facility].  [name] has been  requested as your home health physical therapist. Contact information: [place] [address] [phone]           Discharge Instructions    Call MD / Call 911    Complete by:  As directed    If you experience chest pain or shortness of breath, CALL 911 and be transported to the hospital emergency room.  If you develope a fever above 101 F, pus (white drainage) or increased drainage or redness at the wound, or calf pain, call your surgeon's office.   Change dressing    Complete by:  As directed    Maintain surgical dressing until follow up in the clinic. If the edges start to pull up, may reinforce with tape. If the dressing is no longer working, may remove and cover with gauze and tape, but must keep the area dry and clean.  Call with any questions or concerns.   Constipation Prevention    Complete by:  As directed    Drink plenty of fluids.  Prune juice may be helpful.  You may use a stool softener, such as Colace (over the counter) 100 mg twice a day.  Use MiraLax (over the counter) for constipation as needed.   Diet - low sodium heart healthy    Complete by:  As directed    Discharge instructions    Complete by:  As directed    Maintain surgical dressing until follow up in the clinic. If the edges start to pull up, may reinforce with tape. If the dressing is no longer working, may remove and cover with gauze and tape, but must keep the area dry and clean.  Follow up in 2 weeks at [facility]. Call with any questions or concerns.   Follow the hip precautions as taught in Physical Therapy    Complete by:  As directed    Increase activity slowly as tolerated    Complete by:  As directed    Weight bearing as tolerated with assist device (walker, cane, etc) as directed, use it as long as suggested by your surgeon or therapist, typically at least 4-6 weeks.   TED hose    Complete by:  As directed    Use stockings (TED hose) for 2 weeks on both  leg(s).  You may remove them at night for sleeping.        Medication List    STOP taking these medications   acetaminophen 500 MG tablet Commonly known as:  TYLENOL   aspirin EC 81 MG tablet Replaced by:  aspirin 81 MG chewable tablet     TAKE these medications   aspirin 81 MG chewable tablet Chew 1 tablet (81 mg total) by mouth 2 (two) times daily. Take for 4 weeks, then resume regular dose. Replaces:  aspirin EC 81 MG tablet   docusate sodium 100 MG capsule Commonly known as:  COLACE Take 1 capsule (100 mg total) by mouth  2 (two) times daily.   ferrous sulfate 325 (65 FE) MG tablet Take 1 tablet (325 mg total) by mouth 3 (three) times daily after meals.   HYDROcodone-acetaminophen 7.5-325 MG tablet Commonly known as:  NORCO Take 1-2 tablets by mouth every 4 (four) hours as needed for moderate pain.   methocarbamol 500 MG tablet Commonly known as:  ROBAXIN Take 1 tablet (500 mg total) by mouth every 6 (six) hours as needed for muscle spasms.   polyethylene glycol packet Commonly known as:  MIRALAX / GLYCOLAX Take 17 g by mouth 2 (two) times daily.   pravastatin 20 MG tablet Commonly known as:  PRAVACHOL Take 1 tablet (20 mg total) by mouth daily.        Signed: [place] Pugh. Charmelle Soh   PA-C  11/17/2015, 7:27 AM

## 2015-12-10 ENCOUNTER — Other Ambulatory Visit: Payer: BC Managed Care – PPO

## 2016-03-29 ENCOUNTER — Encounter: Payer: Self-pay | Admitting: Medical

## 2016-03-29 ENCOUNTER — Ambulatory Visit (INDEPENDENT_AMBULATORY_CARE_PROVIDER_SITE_OTHER): Payer: BC Managed Care – PPO | Admitting: Medical

## 2016-03-29 VITALS — BP 144/88 | HR 82 | Wt 244.4 lb

## 2016-03-29 DIAGNOSIS — R05 Cough: Secondary | ICD-10-CM | POA: Diagnosis not present

## 2016-03-29 DIAGNOSIS — E860 Dehydration: Secondary | ICD-10-CM

## 2016-03-29 DIAGNOSIS — R059 Cough, unspecified: Secondary | ICD-10-CM

## 2016-03-29 DIAGNOSIS — R6889 Other general symptoms and signs: Secondary | ICD-10-CM

## 2016-03-29 LAB — POC INFLUENZA A&B (BINAX/QUICKVUE)
INFLUENZA A, POC: NEGATIVE
INFLUENZA B, POC: NEGATIVE

## 2016-03-29 NOTE — Progress Notes (Signed)
Subjective: Chief Complaint  Patient presents with  . coughing ,bodyaches    coughing, bodyaches, chills   Here for illness.  Started a week ago with cough, body aches, chills, subjective fever, watery eyes, feeling hot and cold at different times, some sweats, back and neck ache.   No sore throat, no NVD, no wheezing, no rash.  Has had flu contacts at work.   Uses theraflu.   Felt a little better over the weekend, then got worse again.  No other aggravating or relieving factors. No other complaint.  Past Medical History:  Diagnosis Date  . Anal fissure 1980  . Hip pain, bilateral    pending surgery 09/2015, [facility]  . History of testicular cancer   . Hypertension 09/24/2015   no meds currently-elevates with MD visits.  . Lipoprotein deficiencies   . Low HDL (under 40)   . Osteoarthritis of hip   . Prostate cancer [facility]) 2017   robotic prostatectomy 06-04-15  . Wears glasses    Current Outpatient Prescriptions on File Prior to Visit  Medication Sig Dispense Refill  . docusate sodium (COLACE) 100 MG capsule Take 1 capsule (100 mg total) by mouth 2 (two) times daily. (Patient not taking: Reported on 03/29/2016) 10 capsule 0  . ferrous sulfate 325 (65 FE) MG tablet Take 1 tablet (325 mg total) by mouth 3 (three) times daily after meals. (Patient not taking: Reported on 03/29/2016)  3  . HYDROcodone-acetaminophen (NORCO) 7.5-325 MG tablet Take 1-2 tablets by mouth every 4 (four) hours as needed for moderate pain. (Patient not taking: Reported on 03/29/2016) 100 tablet 0  . methocarbamol (ROBAXIN) 500 MG tablet Take 1 tablet (500 mg total) by mouth every 6 (six) hours as needed for muscle spasms. (Patient not taking: Reported on 03/29/2016) 40 tablet 0  . polyethylene glycol (MIRALAX / GLYCOLAX) packet Take 17 g by mouth 2 (two) times daily. (Patient not taking: Reported on 03/29/2016) 14 each 0  . pravastatin (PRAVACHOL) 20 MG tablet Take 1 tablet (20 mg total) by mouth daily. (Patient not  taking: Reported on 10/31/2015) 90 tablet 1   No current facility-administered medications on file prior to visit.      Objective: BP (!) 144/88   Pulse 82   Wt 244 lb 6.4 oz (110.9 kg)   SpO2 98%   BMI 36.09 kg/m   General: somewhat Ill-appearing, well-developed, well-nourished Skin: Hot, dry HEENT: Nose inflamed and congested, clear conjunctiva, TMs pearly, no sinus tenderness, pharynx with erythema, no exudates Neck: Supple, non tender, shotty cervical adenopathy Heart: Regular rate and rhythm, normal S1, S2, no murmurs Lungs: Clear to auscultation bilaterally, no wheezes, rales, rhonchi Abdomen: Non tender non distended Extremities: Mild generalized tenderness   Assessment: Encounter Diagnoses  Name Primary?  . Flu-like symptoms Yes  . Cough   . Mild dehydration     Plan: Discussed diagnosis of flu like illness. Discussed supportive care including rest, hydration, OTC Tylenol for fever, aches, and malaise.  Discussed period of contagion, self quarantine at home away from others to avoid spread of disease, discussed means of transmission, and possible complications including pneumonia.  Offered CXR today but he declines.  He had reportedly normal CBC within past month with oncology in [place], [place].  If worse or not improving within the next 2-3 days, then call or return.  Patient voiced understanding of diagnosis, recommendations, and treatment plan.  Gave note for work.  Of note, just had hip surgery in recent months with Dr.  Paralee Cancel, sees Dr. Kathaleen Grinder at [facility] for hx/o cancer, surgery early 2017 for this.    F/u prn

## 2016-04-02 ENCOUNTER — Telehealth: Payer: Self-pay | Admitting: Medical

## 2016-04-02 NOTE — Telephone Encounter (Signed)
Pt said he returned to work yesterday as instructed but when he got to work his Freight forwarder noticed that he kept sweating a lot and they sent him home. Pt stayed home today instead of driving to [place] to work and risk the chance of getting sick and/or being sent home from [place] again. Requesting a work note excusing him from work yesterday and today and to return on Monday.

## 2016-04-02 NOTE — Telephone Encounter (Signed)
That is fine.  Does he feels he is improving?

## 2016-04-02 NOTE — Telephone Encounter (Signed)
Advised pt that note is ready for pick up.  He feels that he improving today since he has just been resting.

## 2016-11-05 IMAGING — DX DG PELVIS 1-2V
1 series · 1 of 1 positions shown · non-contrast
Comparison: CT abdomen pelvis of 07/02/2004 and pelvis film of
09/30/2015

CLINICAL DATA: Left hip replacement

EXAM:
PELVIS - 1-2 VIEW

[pelvis ap]
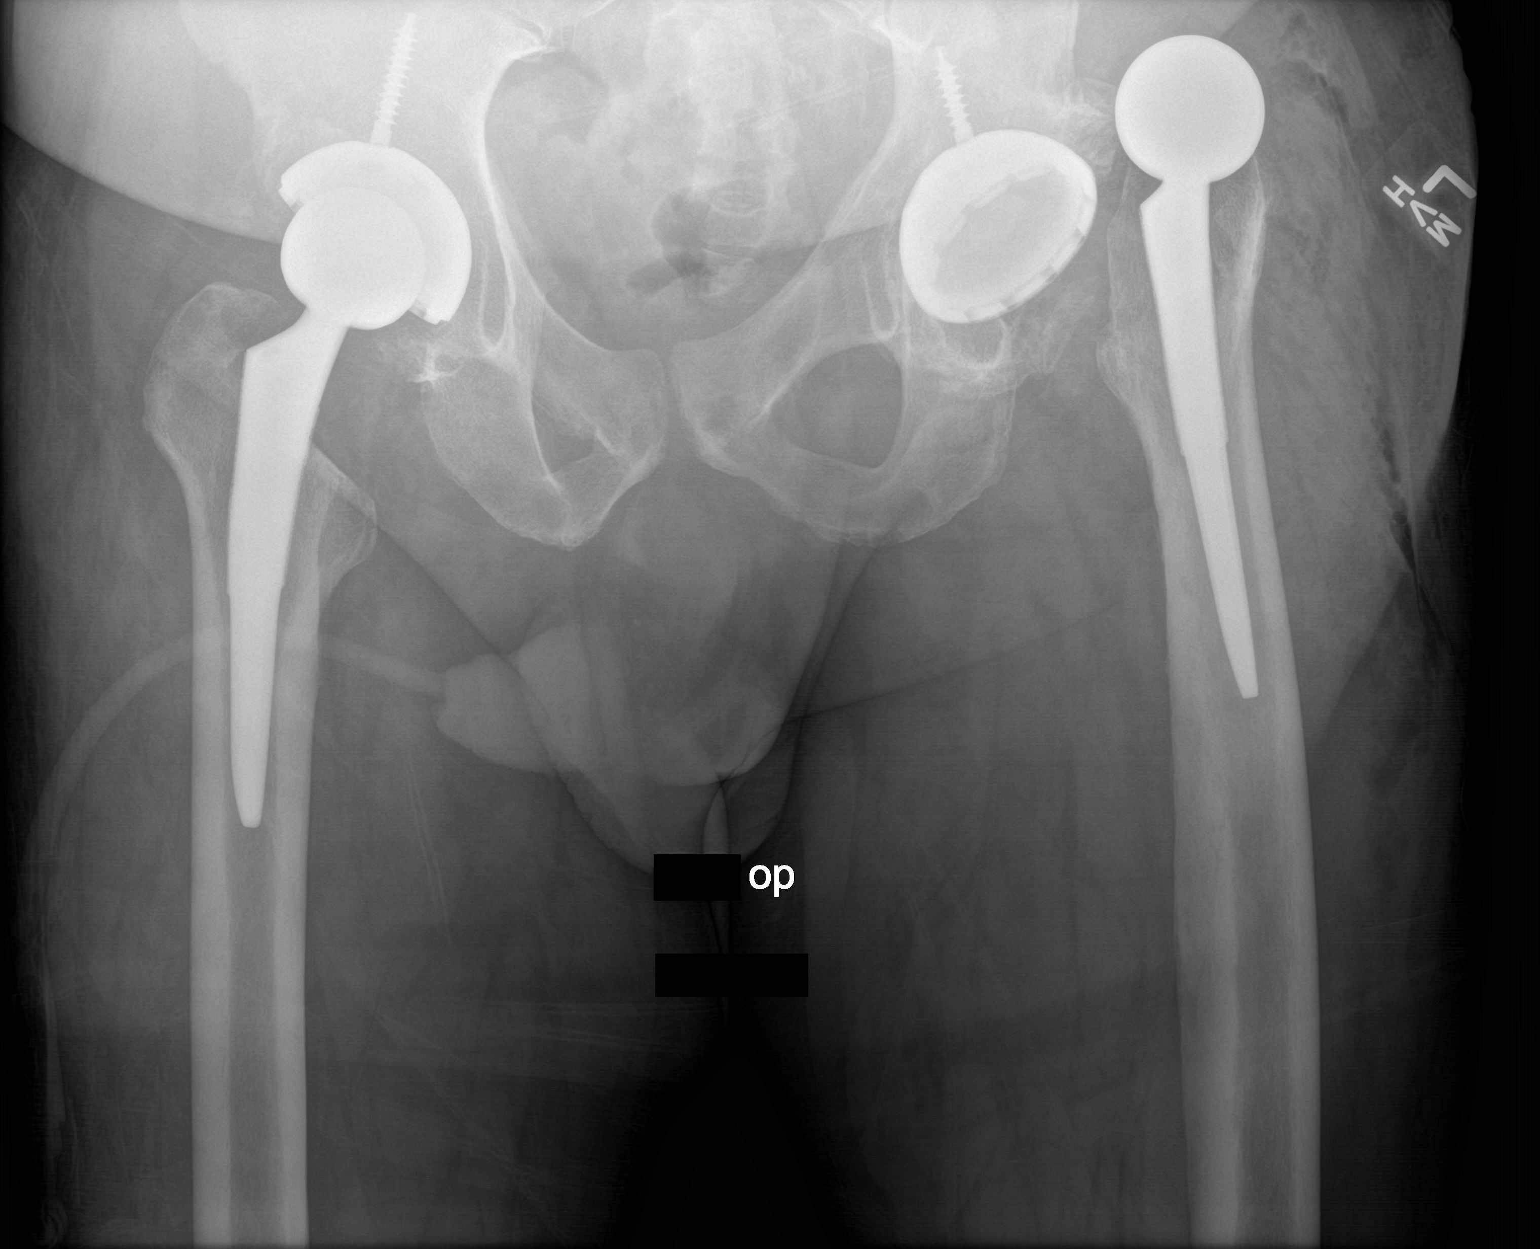

[1 of 1 positions shown; findings below may reference images not displayed]

FINDINGS: The right total hip replacement is unchanged. However there is
dislocation of the femoral component of the left total hip
replacement from the left acetabular cup with the femoral head
located lateral and superior to the left acetabulum. No acute
fracture is seen.
IMPRESSION: Dislocation of the left total hip replacement.

## 2016-11-05 IMAGING — DX DG PORTABLE PELVIS
1 series · 1 of 1 positions shown · non-contrast
Comparison: 11/11/2015 at 8111 hours

CLINICAL DATA: Post reduction of a left hip prosthesis dislocation.

EXAM:
PORTABLE PELVIS 1-2 VIEWS

[pelvis ap]
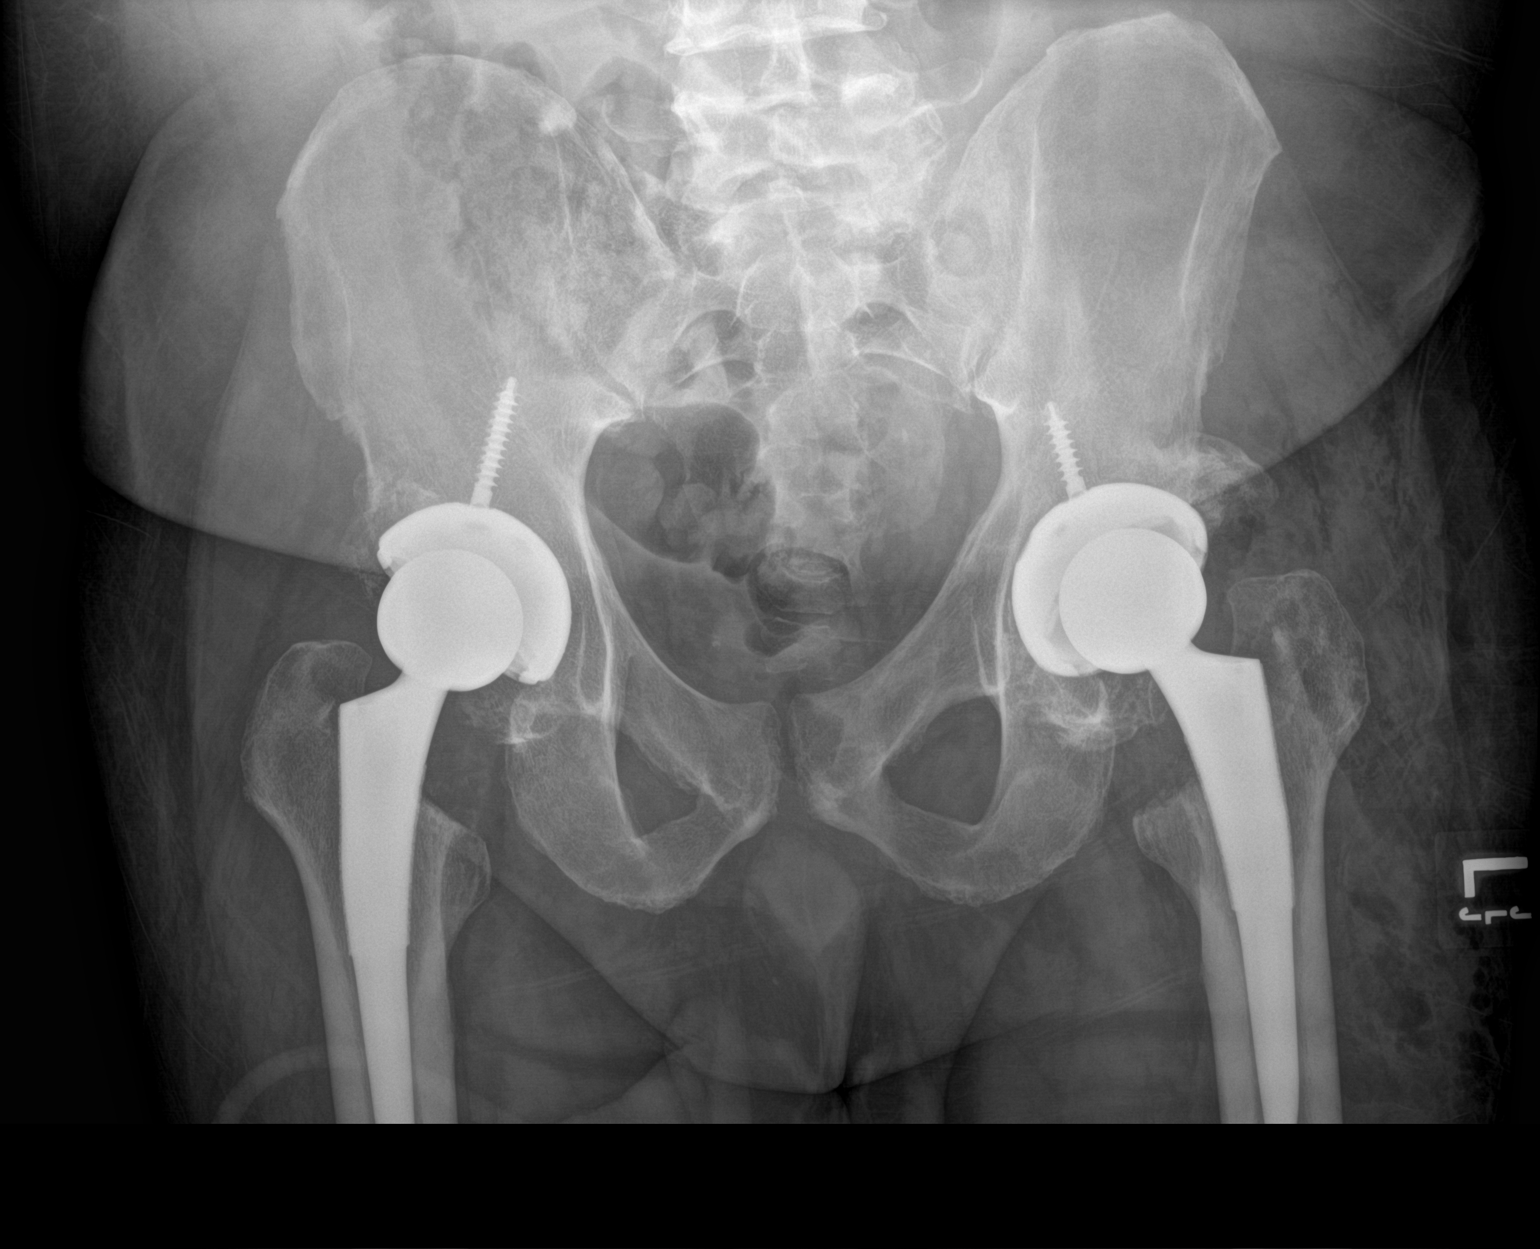

[1 of 1 positions shown; findings below may reference images not displayed]

FINDINGS: On the single AP view, the femoral component now aligns normally
with the acetabular component consistent with successful reduction.

There is no convincing fracture. Bilateral total hip arthroplasty
components appear well seated.
IMPRESSION: Successful reduction of the left hip prosthesis dislocation.

## 2016-12-29 ENCOUNTER — Emergency Department (HOSPITAL_COMMUNITY): Payer: BC Managed Care – PPO

## 2016-12-29 ENCOUNTER — Encounter (HOSPITAL_COMMUNITY): Payer: Self-pay | Admitting: *Deleted

## 2016-12-29 DIAGNOSIS — R0789 Other chest pain: Secondary | ICD-10-CM | POA: Diagnosis present

## 2016-12-29 DIAGNOSIS — I10 Essential (primary) hypertension: Secondary | ICD-10-CM | POA: Diagnosis not present

## 2016-12-29 LAB — BASIC METABOLIC PANEL
Anion gap: 7 (ref 5–15)
BUN: 9 mg/dL (ref 6–20)
CHLORIDE: 104 mmol/L (ref 101–111)
CO2: 25 mmol/L (ref 22–32)
Calcium: 8.8 mg/dL — ABNORMAL LOW (ref 8.9–10.3)
Creatinine, Ser: 1.08 mg/dL (ref 0.61–1.24)
GFR calc Af Amer: >60 mL/min (ref >60)
GLUCOSE: 83 mg/dL (ref 65–99)
POTASSIUM: 3.9 mmol/L (ref 3.5–5.1)
Sodium: 136 mmol/L (ref 135–145)

## 2016-12-29 LAB — CBC
HEMATOCRIT: 45.5 % (ref 39.0–52.0)
Hemoglobin: 15.1 g/dL (ref 13.0–17.0)
MCH: 30.6 pg (ref 26.0–34.0)
MCHC: 33.2 g/dL (ref 30.0–36.0)
MCV: 92.3 fL (ref 78.0–100.0)
Platelets: 200 10*3/uL (ref 150–400)
RBC: 4.93 MIL/uL (ref 4.22–5.81)
RDW: 13.8 % (ref 11.5–15.5)
WBC: 9.1 10*3/uL (ref 4.0–10.5)

## 2016-12-29 LAB — TROPONIN I: Troponin I: <0.03 ng/mL (ref <0.03)

## 2016-12-29 NOTE — ED Triage Notes (Signed)
Lt sided chest and lt arm pain for 2w days  No son

## 2016-12-30 ENCOUNTER — Emergency Department (HOSPITAL_COMMUNITY)
Admission: EM | Admit: 2016-12-30 | Discharge: 2016-12-30 | Disposition: A | Discharge status: Home / Self Care | Payer: BC Managed Care – PPO | Attending: Emergency Medicine | Admitting: Emergency Medicine

## 2016-12-30 DIAGNOSIS — R0789 Other chest pain: Secondary | ICD-10-CM

## 2016-12-30 LAB — I-STAT TROPONIN, ED: Troponin i, poc: 0 ng/mL (ref 0.00–0.08)

## 2016-12-30 NOTE — Discharge Instructions (Signed)
You may alternate Tylenol 1000 mg every 6 hours as needed for pain and Ibuprofen 800 mg every 8 hours as needed for pain.  Please take Ibuprofen with food.   Please call your primary care doctor in the morning for close outpatient follow-up and possible outpatient stress test.  Your labs today here were normal as was your EKG and chest xray.

## 2016-12-30 NOTE — ED Notes (Signed)
MD at bedside. 

## 2016-12-30 NOTE — ED Notes (Signed)
Pt very upset regarding wait times, states "it's totally disrespectful to a human." Apologized multiple times for delays, attempted to explain delays to patient - pt shaking his head left and right. Informed pt that I would notify charge nurse and do what I could to get him a treatment room.

## 2016-12-30 NOTE — ED Provider Notes (Signed)
TIME SEEN: 4:25 AM  CHIEF COMPLAINT: Chest pain  HPI: Patient is a 61 year old male with history of prostate cancer and testicular cancer currently in remission who presents to the emergency department with complaints of chest pain.  Pain is been intermittent for the past 2 days and is left-sided and described as sharp in nature.  It is not exertional or pleuritic.  It is worse with palpation of his chest wall.  He does sometimes get tingling in the fingers of the left hand.  No numbness.  No focal weakness.  No shortness of breath, nausea or vomiting, diaphoresis or dizziness.  No leg swelling or tenderness.  No fever or cough.  No history of PE or DVT.  No history of CAD.  No family history of premature CAD.  He does have a primary care physician.  States he does walk regularly and he never notices that this makes his pain worse.  ROS: See HPI Constitutional: no fever  Eyes: no drainage  ENT: no runny nose   Cardiovascular:   chest pain  Resp: no SOB  GI: no vomiting GU: no dysuria Integumentary: no rash  Allergy: no hives  Musculoskeletal: no leg swelling  Neurological: no slurred speech ROS otherwise negative  PAST MEDICAL HISTORY/PAST SURGICAL HISTORY:  Past Medical History:  Diagnosis Date  . Anal fissure 1980  . Hip pain, bilateral    pending surgery 09/2015, [facility]  . History of testicular cancer   . Hypertension 09/24/2015   no meds currently-elevates with MD visits.  . Lipoprotein deficiencies   . Low HDL (under 40)   . Osteoarthritis of hip   . Prostate cancer [facility]) 2017   robotic prostatectomy 06-04-15  . Wears glasses     MEDICATIONS:  Prior to Admission medications   Medication Sig Start Date End Date Taking? Authorizing Provider  docusate sodium (COLACE) 100 MG capsule Take 1 capsule (100 mg total) by mouth 2 (two) times daily. Patient not taking: Reported on 03/29/2016 11/11/15   Danae Orleans, PA-C  ferrous sulfate 325 (65 FE) MG tablet Take 1 tablet  (325 mg total) by mouth 3 (three) times daily after meals. Patient not taking: Reported on 03/29/2016 11/11/15   Danae Orleans, PA-C  HYDROcodone-acetaminophen (NORCO) 7.5-325 MG tablet Take 1-2 tablets by mouth every 4 (four) hours as needed for moderate pain. Patient not taking: Reported on 03/29/2016 11/11/15   Danae Orleans, PA-C  methocarbamol (ROBAXIN) 500 MG tablet Take 1 tablet (500 mg total) by mouth every 6 (six) hours as needed for muscle spasms. Patient not taking: Reported on 03/29/2016 11/11/15   Danae Orleans, PA-C  polyethylene glycol (MIRALAX / GLYCOLAX) packet Take 17 g by mouth 2 (two) times daily. Patient not taking: Reported on 03/29/2016 11/11/15   Danae Orleans, PA-C  pravastatin (PRAVACHOL) 20 MG tablet Take 1 tablet (20 mg total) by mouth daily. Patient not taking: Reported on 10/31/2015 09/18/15   Carlena Hurl, PA-C    ALLERGIES:  Allergies  Allergen Reactions  . Mobic [Meloxicam] Other (See Comments)    bleeding    SOCIAL HISTORY:  Social History  Substance Use Topics  . Smoking status: Never Smoker  . Smokeless tobacco: Never Used  . Alcohol use No    FAMILY HISTORY: Family History  Problem Relation Age of Onset  . Arthritis Mother   . Diabetes Mother   . Stroke Mother   . Hypertension Mother   . Heart disease Father  aortic disease, possible dissection  . Hypertension Sister   . Mental illness Sister   . Diabetes Sister   . Hypertension Brother     EXAM: BP (!) 139/91   Pulse 66   Temp 97.8 F (36.6 C) (Oral)   Resp 18   Ht 5\' 9"  (1.753 m)   Wt 108.9 kg (240 lb)   SpO2 94%   BMI 35.44 kg/m  CONSTITUTIONAL: Alert and oriented and responds appropriately to questions. Well-appearing; well-nourished HEAD: Normocephalic EYES: Conjunctivae clear, pupils appear equal, EOMI ENT: normal nose; moist mucous membranes NECK: Supple, no meningismus, no nuchal rigidity, no LAD  CARD: RRR; S1 and S2 appreciated; no murmurs, no clicks, no  rubs, no gallops CHEST:  Chest wall is tender to palpation over the left anterior chest wall which reproduces his pain.  No crepitus, ecchymosis, erythema, warmth, rash or other lesions present.   RESP: Normal chest excursion without splinting or tachypnea; breath sounds clear and equal bilaterally; no wheezes, no rhonchi, no rales, no hypoxia or respiratory distress, speaking full sentences ABD/GI: Normal bowel sounds; non-distended; soft, non-tender, no rebound, no guarding, no peritoneal signs, no hepatosplenomegaly BACK:  The back appears normal and is non-tender to palpation, there is no CVA tenderness EXT: Normal ROM in all joints; non-tender to palpation; no edema; normal capillary refill; no cyanosis, no calf tenderness or swelling, 2+ radial and DP pulses bilaterally    SKIN: Normal color for age and race; warm; no rash NEURO: Moves all extremities equally, normal sensation diffusely PSYCH: The patient's mood and manner are appropriate. Grooming and personal hygiene are appropriate.  MEDICAL DECISION MAKING: Patient here with complaints of chest pain.  Seems very atypical in nature.  No tachycardia, hypoxia or pleuritic nature.  Doubt PE.  Low suspicion for ACS.  He has had 2- troponins and a normal EKG.  Chest x-ray is clear.  Doubt dissection.  This seems to be musculoskeletal in nature as it is reproducible with palpation.  Recommended alternating Tylenol and Motrin for pain.  I feel he is safe for discharge to follow-up with his outpatient provider for possible stress test.  I do not feel he needs admission for stress test at this time.  His heart score is 1.  Discussed this with patient and his wife were both comfortable with this plan.  We did discuss at length return precautions.  At this time, I do not feel there is any life-threatening condition present. I have reviewed and discussed all results (EKG, imaging, lab, urine as appropriate) and exam findings with patient/family. I have  reviewed nursing notes and appropriate previous records.  I feel the patient is safe to be discharged home without further emergent workup and can continue workup as an outpatient as needed. Discussed usual and customary return precautions. Patient/family verbalize understanding and are comfortable with this plan.  Outpatient follow-up has been provided if needed. All questions have been answered.     EKG Interpretation  Date/Time:  Wednesday December 29 2016 18:56:47 EDT Ventricular Rate:  71 PR Interval:  176 QRS Duration: 80 QT Interval:  374 QTC Calculation: 406 R Axis:   50 Text Interpretation:  Normal sinus rhythm Normal ECG No significant change since last tracing Confirmed by Christiann Hagerty, Cyril Mourning [phone]) on 12/30/2016 4:26:40 AM           Jean Alejos, Delice Bison, DO 12/30/16 [id number]

## 2017-02-23 ENCOUNTER — Encounter: Payer: Self-pay | Admitting: Medical

## 2017-02-23 ENCOUNTER — Ambulatory Visit: Payer: BC Managed Care – PPO | Admitting: Medical

## 2017-02-23 VITALS — BP 132/80 | HR 80 | Ht 68.0 in | Wt 243.8 lb

## 2017-02-23 DIAGNOSIS — E782 Mixed hyperlipidemia: Secondary | ICD-10-CM | POA: Diagnosis not present

## 2017-02-23 DIAGNOSIS — Z0000 Encounter for general adult medical examination without abnormal findings: Secondary | ICD-10-CM

## 2017-02-23 DIAGNOSIS — R7301 Impaired fasting glucose: Secondary | ICD-10-CM

## 2017-02-23 DIAGNOSIS — Z7189 Other specified counseling: Secondary | ICD-10-CM

## 2017-02-23 DIAGNOSIS — M169 Osteoarthritis of hip, unspecified: Secondary | ICD-10-CM

## 2017-02-23 DIAGNOSIS — Z1211 Encounter for screening for malignant neoplasm of colon: Secondary | ICD-10-CM

## 2017-02-23 DIAGNOSIS — E669 Obesity, unspecified: Secondary | ICD-10-CM

## 2017-02-23 DIAGNOSIS — Z96642 Presence of left artificial hip joint: Secondary | ICD-10-CM | POA: Diagnosis not present

## 2017-02-23 DIAGNOSIS — M79644 Pain in right finger(s): Secondary | ICD-10-CM

## 2017-02-23 DIAGNOSIS — I10 Essential (primary) hypertension: Secondary | ICD-10-CM

## 2017-02-23 DIAGNOSIS — M7989 Other specified soft tissue disorders: Secondary | ICD-10-CM | POA: Diagnosis not present

## 2017-02-23 DIAGNOSIS — Z7185 Encounter for immunization safety counseling: Secondary | ICD-10-CM

## 2017-02-23 NOTE — Patient Instructions (Signed)
  Thank you for giving me the opportunity to serve you today.    Your diagnosis today includes: Encounter Diagnoses  Name Primary?  . Encounter for health maintenance examination in adult Yes  . Essential hypertension   . Impaired fasting blood sugar   . Mixed dyslipidemia   . Obesity with serious comorbidity, unspecified classification, unspecified obesity type   . Status post left hip replacement   . Vaccine counseling   . Osteoarthritis of hip, unspecified laterality, unspecified osteoarthritis type   . Screen for colon cancer   . Thumb pain, right   . Thumb swelling     Recommendations:  Eat a healthy low fat diet  Exercise most days per week, at least 30 minutes or more  See your eye doctor yearly for routine vision care.  See your dentist yearly for routine dental care including hygiene visits twice yearly.  I recommend you have a Shingles Vaccine to help prevent shingles or herpes zoster outbreak.   Please call your insurer to inquire about coverage for the Shingrix vaccine given in 2 doses.   Some insurers cover this vaccine after age 34, some cover this after age 30.  If your insurer covers this, then call to schedule appointment to have this vaccine here.  You will also be due for Tdap tetanus diptheria pertussis vaccine in 2019  I recommend a yearly Influenza/Flu vaccine, typically in September to help reduce the risk of you and others getting the flu illness  We are sending you home with stool cards to return so we can check them for blood.   This is a type of screen for colon cancer done every 3-5 years.  We will call with lab results  See me yearly for a physical

## 2017-02-23 NOTE — Progress Notes (Signed)
Subjective:   HPI  Mark Valentine is a 61 y.o. male who presents for a complete physical.   Medical care team includes:  Dr. Philipp Ovens, dentist  Eye doctor  Urology, Dr. Morrison Old  Dr. Paralee Cancel, orthopedist  Dorothea Ogle, PA-C along with Dr. Redmond School here for primary care   Prior vaccinations: TD or Tdap:9 years ago  Influenza: yes gets every year Pneumococcal: n/a Shingles/Zostavax:n/a   Concerns: S/p robotic prostatectomy in April 2017, still dealing with urinary incontinence issues but improving.  Last visit 07/2016.  Has f/u 1 year.  Mom passed away last year.  He is not driving back and forth to [place] now.  He had hip procedure Jun 25, 2015 given ongoing issues with left hip.    His only recent issue is right thumb pain, swelling, and trigger motion of distal thumb.  He thinks he may have injured this but not sure.  No hx/o gout.   He has resumed going to karate class.  Reviewed their medical, surgical, family, social, medication, and allergy history and updated chart as appropriate.  Past Medical History:  Diagnosis Date  . Anal fissure 1980  . Hip pain, bilateral    pending surgery 09/2015, [facility]  . History of testicular cancer   . Hypertension 09/24/2015   no meds currently-elevates with MD visits.  . Lipoprotein deficiencies   . Low HDL (under 40)   . Osteoarthritis of hip   . Prostate cancer [facility]) 06/25/2015   robotic prostatectomy 06-04-15  . Wears glasses     Past Surgical History:  Procedure Laterality Date  . anal fistula repair  1980's  . COLONOSCOPY  02/01/11   normal, repeat 06/24/20; Dr. Sharlett Iles  . HIP CLOSED REDUCTION Left 11/11/2015   Procedure: CLOSED REDUCTION HIP;  Surgeon: Paralee Cancel, MD;  Location: [facility];  Service: Orthopedics;  Laterality: Left;  . ORCHIECTOMY  1997   prostate cancer- one testicle only  . PROSTATE BIOPSY  06/24/08   Dr. Amalia Hailey, Urology  . PROSTATECTOMY  05/2015   robotic  . TOTAL HIP ARTHROPLASTY Right  09/30/2015   Procedure: RIGHT TOTAL HIP ARTHROPLASTY ANTERIOR APPROACH;  Surgeon: Paralee Cancel, MD;  Location: [facility];  Service: Orthopedics;  Laterality: Right;  . TOTAL HIP ARTHROPLASTY Left 11/11/2015   Procedure: LEFT TOTAL HIP ARTHROPLASTY ANTERIOR APPROACH;  Surgeon: Paralee Cancel, MD;  Location: [facility];  Service: Orthopedics;  Laterality: Left;    Social History   Socioeconomic History  . Marital status: Married    Spouse name: Not on file  . Number of children: 1  . Years of education: Not on file  . Highest education level: Not on file  Social Needs  . Financial resource strain: Not on file  . Food insecurity - worry: Not on file  . Food insecurity - inability: Not on file  . Transportation needs - medical: Not on file  . Transportation needs - non-medical: Not on file  Occupational History  . Not on file  Tobacco Use  . Smoking status: Never Smoker  . Smokeless tobacco: Never Used  Substance and Sexual Activity  . Alcohol use: No  . Drug use: No  . Sexual activity: Yes  Other Topics Concern  . Not on file  Social History Narrative   Married, limited exercise, former judge.  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  First wife passed away June 25, 1999.  Has 1 daugthter.  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  Commutes to [place] daily.  As of 7/ 2017    Family History  Problem Relation Age of Onset  . Arthritis Mother   . Diabetes Mother   . Stroke Mother   . Hypertension Mother   . Heart disease Father        aortic disease, possible dissection  . Hypertension Sister   . Mental illness Sister   . Diabetes Sister   . Hypertension Brother      Current Outpatient Medications:  .  aspirin 325 MG tablet, Take 325 mg by mouth daily., Disp: , Rfl:   Allergies  Allergen Reactions  . Mobic [Meloxicam] Other (See Comments)    bleeding      Review of Systems Constitutional: -fever, -chills, -sweats, -unexpected weight change, -decreased appetite,  -fatigue Allergy: -sneezing, -itching, -congestion Dermatology: -changing moles, --rash, -lumps ENT: -runny nose, -ear pain, -sore throat, -hoarseness, -sinus pain, -teeth pain, - ringing in ears, -hearing loss, -nosebleeds Cardiology: -chest pain, -palpitations, -swelling, -difficulty breathing when lying flat, -waking up short of breath Respiratory: -cough, -shortness of breath, -difficulty breathing with exercise or exertion, -wheezing, -coughing up blood Gastroenterology: -abdominal pain, -nausea, -vomiting, -diarrhea, -constipation, -blood in stool, -changes in bowel movement, -difficulty swallowing or eating Hematology: -bleeding, -bruising  Musculoskeletal: +joint aches, -muscle aches, -joint swelling, -back pain, -neck pain, -cramping, -changes in gait Ophthalmology: denies vision changes, eye redness, itching, discharge Urology: -burning with urination, -difficulty urinating, -blood in urine, -urinary frequency, -urgency, -incontinence Neurology: -headache, -weakness, -tingling, -numbness, -memory loss, -falls, -dizziness Psychology: -depressed mood, -agitation, -sleep problems     Objective:   Physical Exam  BP 132/80   Pulse 80   Ht 5\' 8"  (1.727 m)   Wt 243 lb 12.8 oz (110.6 kg)   SpO2 96%   BMI 37.07 kg/m   General appearance: alert, no distress, WD/WN, AA male Skin: right upper back with medium to large pedunculated skin tag, several small skin tags along neck anterior and bilat, no worrisome lesions HEENT: normocephalic, conjunctiva/corneas normal, sclerae anicteric, PERRLA, EOMi, nares patent, no discharge or erythema, pharynx normal Oral cavity: MMM, tongue normal, teeth in good repair Neck: supple, no lymphadenopathy, no thyromegaly, no masses, normal ROM, no bruits Chest: non tender, normal shape and expansion Heart: RRR, normal S1, S2, no murmurs Lungs: CTA bilaterally, no wheezes, rhonchi, or rales Abdomen: +bs, soft, non tender, non distended, no masses, no  hepatomegaly, no splenomegaly, no bruits Back: non tender, noright thumb with localized swelling of MCP and tender same area, trigger motion felt with flexion of DIP, no other tenderness.  otherwise upper extremities non tender, no obvious deformity, normal ROM throughout, lower extremities non tender, no obvious deformity, normal ROM throughout Extremities: no edema, no cyanosis, no clubbing Pulses: 2+ symmetric, upper and lower extremities, normal cap refill Neurological: alert, oriented x 3, CN2-12 intact, strength normal upper extremities and lower extremities, sensation normal throughout, DTRs 2+ throughout, no cerebellar signs, gait normal Psychiatric: normal affect, behavior normal, pleasant  GU/rectal - he has an adult pad on currently due to urinary incontinence, otherwise exam deferred to urology   Assessment and Plan :    Encounter Diagnoses  Name Primary?  . Encounter for health maintenance examination in adult Yes  . Essential hypertension   . Impaired fasting blood sugar   . Mixed dyslipidemia   . Obesity with serious comorbidity, unspecified classification, unspecified obesity type   . Status post left hip replacement   . Vaccine counseling   . Osteoarthritis of hip, unspecified laterality, unspecified osteoarthritis type   .  Screen for colon cancer   . Thumb pain, right   . Thumb swelling    Physical exam - discussed healthy lifestyle, diet, exercise, preventative care, vaccinations, and addressed their concerns.   Advised they see a dentist yearly for routine dental care including hygiene visits twice yearly. Advised they see an eye doctor yearly for routine vision care. Routine screening labs today  Reviewed recent ED visit 11/2016 labs, EKG, xray given c/o chest pain which hasnt recurred since.  Vaccinations: Counseled on Shingrix and Tdap.  He will consider and check insurance coverage  Other concerns today: Obesity - work on lifestyle changes and weight  loss  Hypertension - stable off medication  Mixed dyslipidemia - labs today, consider medication.  He declined medication 2017.  Thumb pain - go for xray  Colon cancer screening - will send home with stool cards x 3 today  prostate cancer - reviewed urology notes 07/2016, he is s/p robotic prostatectomy  Follow up pending labs  Reyli was seen today for annual exam.  Diagnoses and all orders for this visit:  Encounter for health maintenance examination in adult -     Hemoglobin A1c -     Lipid panel -     Hepatic function panel -     Iron, TIBC and Ferritin Panel -     Uric acid  Essential hypertension -     Hemoglobin A1c -     Lipid panel  Impaired fasting blood sugar -     Hemoglobin A1c  Mixed dyslipidemia -     Lipid panel  Obesity with serious comorbidity, unspecified classification, unspecified obesity type  Status post left hip replacement  Vaccine counseling  Osteoarthritis of hip, unspecified laterality, unspecified osteoarthritis type -     Uric acid  Screen for colon cancer  Thumb pain, right -     Uric acid -     DG Finger Thumb Right; Future  Thumb swelling -     DG Finger Thumb Right; Future

## 2017-02-24 ENCOUNTER — Other Ambulatory Visit: Payer: Self-pay | Admitting: Medical

## 2017-02-24 LAB — LIPID PANEL
CHOLESTEROL: 160 mg/dL (ref <200)
HDL: 28 mg/dL — AB (ref >40)
LDL Cholesterol (Calc): 106 mg/dL (calc) — ABNORMAL HIGH
NON-HDL CHOLESTEROL (CALC): 132 mg/dL — AB (ref <130)
TRIGLYCERIDES: 147 mg/dL (ref <150)
Total CHOL/HDL Ratio: 5.7 (calc) — ABNORMAL HIGH (ref <5.0)

## 2017-02-24 LAB — HEMOGLOBIN A1C
HEMOGLOBIN A1C: 5.8 %{Hb} — AB (ref <5.7)
MEAN PLASMA GLUCOSE: 120 (calc)
eAG (mmol/L): 6.6 (calc)

## 2017-02-24 LAB — IRON,TIBC AND FERRITIN PANEL
%SAT: 26 % (calc) (ref 15–60)
FERRITIN: 102 ng/mL (ref 20–380)
IRON: 71 ug/dL (ref 50–180)
TIBC: 271 mcg/dL (calc) (ref 250–425)

## 2017-02-24 LAB — HEPATIC FUNCTION PANEL
AG Ratio: 1.8 (calc) (ref 1.0–2.5)
ALBUMIN MSPROF: 4.1 g/dL (ref 3.6–5.1)
ALT: 22 U/L (ref 9–46)
AST: 18 U/L (ref 10–35)
Alkaline phosphatase (APISO): 61 U/L (ref 40–115)
BILIRUBIN DIRECT: 0.1 mg/dL (ref 0.0–0.2)
BILIRUBIN INDIRECT: 0.4 mg/dL (ref 0.2–1.2)
BILIRUBIN TOTAL: 0.5 mg/dL (ref 0.2–1.2)
GLOBULIN: 2.3 g/dL (ref 1.9–3.7)
Total Protein: 6.4 g/dL (ref 6.1–8.1)

## 2017-02-24 LAB — URIC ACID: URIC ACID, SERUM: 6.5 mg/dL (ref 4.0–8.0)

## 2017-02-24 MED ORDER — PRAVASTATIN SODIUM 20 MG PO TABS: 20.0000 mg | ORAL_TABLET | Freq: Every evening | ORAL | 0 refills | Status: DC

## 2017-02-25 ENCOUNTER — Telehealth: Payer: Self-pay | Admitting: Medical

## 2017-02-25 NOTE — Telephone Encounter (Signed)
Called andl/m for pt to call us back. 

## 2017-02-25 NOTE — Telephone Encounter (Signed)
Pt returned call to Mark Valentine and requested that when Tarsha call him back, please leave a detailed message on his voicemail because he can not answer the phone a lot at work.

## 2017-05-03 ENCOUNTER — Ambulatory Visit: Payer: Self-pay | Admitting: Medical

## 2017-09-27 ENCOUNTER — Emergency Department (HOSPITAL_COMMUNITY)
Admission: EM | Admit: 2017-09-27 | Discharge: 2017-09-27 | Disposition: A | Discharge status: Home / Self Care | Payer: No Typology Code available for payment source | Attending: Emergency Medicine | Admitting: Emergency Medicine

## 2017-09-27 ENCOUNTER — Emergency Department (HOSPITAL_COMMUNITY): Payer: No Typology Code available for payment source

## 2017-09-27 ENCOUNTER — Other Ambulatory Visit: Payer: Self-pay

## 2017-09-27 ENCOUNTER — Encounter (HOSPITAL_COMMUNITY): Payer: Self-pay

## 2017-09-27 DIAGNOSIS — M545 Low back pain, unspecified: Secondary | ICD-10-CM

## 2017-09-27 DIAGNOSIS — Z96643 Presence of artificial hip joint, bilateral: Secondary | ICD-10-CM | POA: Diagnosis not present

## 2017-09-27 DIAGNOSIS — Z79899 Other long term (current) drug therapy: Secondary | ICD-10-CM | POA: Insufficient documentation

## 2017-09-27 DIAGNOSIS — Z8546 Personal history of malignant neoplasm of prostate: Secondary | ICD-10-CM | POA: Insufficient documentation

## 2017-09-27 DIAGNOSIS — I10 Essential (primary) hypertension: Secondary | ICD-10-CM | POA: Insufficient documentation

## 2017-09-27 DIAGNOSIS — W19XXXA Unspecified fall, initial encounter: Secondary | ICD-10-CM

## 2017-09-27 DIAGNOSIS — R030 Elevated blood-pressure reading, without diagnosis of hypertension: Secondary | ICD-10-CM

## 2017-09-27 DIAGNOSIS — Z7982 Long term (current) use of aspirin: Secondary | ICD-10-CM | POA: Diagnosis not present

## 2017-09-27 MED ORDER — ACETAMINOPHEN 325 MG PO TABS
650.0000 mg | ORAL_TABLET | Freq: Once | ORAL | Status: AC
  Administered 2017-09-27: 650 mg via ORAL
  Filled 2017-09-27: qty 2

## 2017-09-27 MED ORDER — METHOCARBAMOL 500 MG PO TABS: 500.0000 mg | ORAL_TABLET | Freq: Two times a day (BID) | ORAL | 0 refills | Status: DC

## 2017-09-27 MED ORDER — METHOCARBAMOL 500 MG PO TABS
500.0000 mg | ORAL_TABLET | Freq: Once | ORAL | Status: AC
  Administered 2017-09-27: 500 mg via ORAL
  Filled 2017-09-27: qty 1

## 2017-09-27 MED ORDER — LIDOCAINE 5 % EX PTCH
1.0000 | MEDICATED_PATCH | CUTANEOUS | Status: DC
  Administered 2017-09-27: 1 via TRANSDERMAL
  Filled 2017-09-27: qty 1

## 2017-09-27 NOTE — ED Provider Notes (Signed)
[facility] DEPT Provider Note   CSN: [id number] Arrival date & time: 09/27/17  1445     History   Chief Complaint Chief Complaint  Patient presents with  . Fall    HPI Mark Valentine is a 62 y.o. male.  HPI  Patient is a 62 year old male with a history of hypertension, prostate cancer, in remission, and bilateral total hip arthroplasty presenting for fall.  Patient reports that he was at work today in a multi like a chair, when it collapsed underneath him.  Patient reports he went onto his left side, and his left temple touch the wall, but patient denies any loss of consciousness, head pain, neck pain, visual disturbance,  retrograde amnesia, or difficulty regaining his bearings.  Patient reports he was able to walk after a few minutes, but reports persistent pain in the left lower back.  Patient denies any weakness or numbness in lower extremity's, saddle anesthesia, loss of bowel bladder control, urinary retention with overflow incontinence, or difficulty walking.  Patient denies taking any remedies prior to arrival for symptoms. No blood thinning medications.  Past Medical History:  Diagnosis Date  . Anal fissure 1980  . Hip pain, bilateral    pending surgery 09/2015, [facility]  . History of testicular cancer   . Hypertension 09/24/2015   no meds currently-elevates with MD visits.  . Lipoprotein deficiencies   . Low HDL (under 40)   . Osteoarthritis of hip   . Prostate cancer [facility]) 2017   robotic prostatectomy 06-04-15  . Wears glasses     Patient Active Problem List   Diagnosis Date Noted  . Thumb pain, right 02/23/2017  . Thumb swelling 02/23/2017  . S/P left THA, AA 11/11/2015  . Encounter for health maintenance examination in adult 09/17/2015  . Preop examination 09/17/2015  . Vaccine counseling 09/17/2015  . 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 Essential hypertension 07/17/2014  . Impaired fasting blood sugar 07/17/2014  .  Mixed dyslipidemia 07/17/2014  . Obesity 07/17/2014  . Screen for colon cancer 01/19/2011    Past Surgical History:  Procedure Laterality Date  . anal fistula repair  1980's  . COLONOSCOPY  02/01/11   normal, repeat 2022; Dr. Sharlett Iles  . HIP CLOSED REDUCTION Left 11/11/2015   Procedure: CLOSED REDUCTION HIP;  Surgeon: Paralee Cancel, MD;  Location: [facility];  Service: Orthopedics;  Laterality: Left;  . ORCHIECTOMY  1997   prostate cancer- one testicle only  . PROSTATE BIOPSY  2010   Dr. Amalia Hailey, Urology  . PROSTATECTOMY  05/2015   robotic  . TOTAL HIP ARTHROPLASTY Right 09/30/2015   Procedure: RIGHT TOTAL HIP ARTHROPLASTY ANTERIOR APPROACH;  Surgeon: Paralee Cancel, MD;  Location: [facility];  Service: Orthopedics;  Laterality: Right;  . TOTAL HIP ARTHROPLASTY Left 11/11/2015   Procedure: LEFT TOTAL HIP ARTHROPLASTY ANTERIOR APPROACH;  Surgeon: Paralee Cancel, MD;  Location: [facility];  Service: Orthopedics;  Laterality: Left;        Home Medications    Prior to Admission medications   Medication Sig Start Date End Date Taking? Authorizing Provider  aspirin 325 MG tablet Take 325 mg by mouth daily.    [provider]  pravastatin (PRAVACHOL) 20 MG tablet Take 1 tablet (20 mg total) by mouth every evening. 02/24/17 02/24/18  Tysinger, Camelia Eng, PA-C    Family History Family History  Problem Relation Age of Onset  . Arthritis Mother   . Diabetes Mother   . Stroke Mother   .  Hypertension Mother   . Heart disease Father        aortic disease, possible dissection  . Hypertension Sister   . Mental illness Sister   . Diabetes Sister   . Hypertension Brother     Social History Social History   Tobacco Use  . Smoking status: Never Smoker  . Smokeless tobacco: Never Used  Substance Use Topics  . Alcohol use: No  . Drug use: No     Allergies   Mobic [meloxicam]   Review of Systems Review of Systems  Gastrointestinal: Negative for abdominal pain.  Genitourinary: Negative  for difficulty urinating and flank pain.  Musculoskeletal: Positive for arthralgias and back pain. Negative for gait problem.  Neurological: Negative for weakness and numbness.     Physical Exam Updated Vital Signs BP (!) 151/96 (BP Location: Left Arm)   Pulse 99   Temp 98.8 F (37.1 C) (Oral)   Resp 18   Ht 5\' 9"  (1.753 m)   Wt 105.2 kg (232 lb)   SpO2 98%   BMI 34.26 kg/m   Physical Exam  Constitutional: He appears well-developed and well-nourished. No distress.  Sitting comfortably in bed.  HENT:  Head: Normocephalic and atraumatic.  There is no contusion overlying the right temple.  Eyes: Conjunctivae are normal. Right eye exhibits no discharge. Left eye exhibits no discharge.  EOMs normal to gross examination.  Neck: Normal range of motion.  Cardiovascular: Normal rate and regular rhythm.  Intact, 2+ DP pulses bilaterally.  Pulmonary/Chest:  Normal respiratory effort. Patient converses comfortably. No audible wheeze or stridor.  Abdominal: He exhibits no distension.  Musculoskeletal: Normal range of motion.  Spine Exam: Inspection/Palpation: No midline tenderness palpation of cervical, thoracic, or lumbar spine.  No flank ecchymosis. Strength: 5/5 throughout LE bilaterally (hip flexion/extension, adduction/abduction; knee flexion/extension; foot dorsiflexion/plantarflexion, inversion/eversion; great toe inversion) Sensation: Intact to light touch in proximal and distal LE bilaterally Reflexes: 2+ quadriceps and achilles reflexes Normal and symmetric gait with no evidence of foot drop.   Neurological: He is alert.  Cranial nerves intact to gross observation. Patient moves extremities without difficulty.  Skin: Skin is warm and dry. He is not diaphoretic.  Psychiatric: He has a normal mood and affect. His behavior is normal. Judgment and thought content normal.  Nursing note and vitals reviewed.    ED Treatments / Results  Labs (all labs ordered are listed, but  only abnormal results are displayed) Labs Reviewed - No data to display  EKG None  Radiology Dg Lumbar Spine Complete  Result Date: 09/27/2017 CLINICAL DATA:  Patient sustained a fall earlier today when a chair broke. History of bilateral hip replacements. Prostate malignancy. Patient is complaining of low back pain. EXAM: LUMBAR SPINE - COMPLETE 4+ VIEW COMPARISON:  Scout radiographs from an abdominal and pelvic CT scan dated Jul 02, 2004 FINDINGS: The lumbar vertebral bodies are preserved in height. There is moderate degenerative disc space narrowing at L3-4 and to a lesser extent at L2-3. There is no spondylolisthesis. There are anterior endplate osteophytes at L2-3 and L3-4. There is mild facet joint hypertrophy at L4-5 and L5-S1. The pedicles and transverse processes are intact where visualized. IMPRESSION: Moderate degenerative disc disease at L3-4 and to a lesser extent at L2-3. No compression fracture or spondylolisthesis. Electronically Signed   By: David  Martinique M.D.   On: 09/27/2017 15:57   Dg Hips Bilat With Pelvis 3-4 Views  Result Date: 09/27/2017 CLINICAL DATA:  The patient fell earlier today from  a chair after the chest broke. History of bilateral hip replacement. The patient reports left lower back pain. EXAM: DG HIP (WITH OR WITHOUT PELVIS) 3-4V BILAT COMPARISON:  AP view of the pelvis of November 11, 2015 FINDINGS: The bony pelvis is subjectively adequately mineralized. There is no acute or healing fracture. AP and lateral views of both prosthetic hips reveal normal interface of the prosthetic components with the native bone. No acute native bone fracture is observed. The overlying soft tissues are unremarkable. IMPRESSION: No acute fracture nor dislocation of the prosthetic hips nor acute bony abnormality of the pelvis. Electronically Signed   By: David  Martinique M.D.   On: 09/27/2017 15:56    Procedures Procedures (including critical care time)  Medications Ordered in  ED Medications  lidocaine (LIDODERM) 5 % 1 patch (1 patch Transdermal Patch Applied 09/27/17 1553)  methocarbamol (ROBAXIN) tablet 500 mg (500 mg Oral Provided for home use 09/27/17 1550)  acetaminophen (TYLENOL) tablet 650 mg (650 mg Oral Given 09/27/17 1549)     Initial Impression / Assessment and Plan / ED Course  I have reviewed the triage vital signs and the nursing notes.  Pertinent labs & imaging results that were available during my care of the patient were reviewed by me and considered in my medical decision making (see chart for details).  Clinical Course as of Sep 28 1606  Tue Sep 27, 2017  1607 BP noted to patient. States PCP has discussed this with him as well.  BP(!): 151/96 [AM]    Clinical Course User Index [AM] Albesa Seen, PA-C    Patient nontoxic-appearing and neurologically intact in the bilateral lower extremities.  There is no flank ecchymosis suggestive of organ injury. Given low trauma fall, feel that this is musculoskeletal nature.  NO evidence of head trauma from fall. Plain films demonstrate no compression fracture, or acute abnormalities of patient's prior hardware.  Will treat patient symptomatically with muscle relaxants, lifting restrictions, and have him follow-up with primary care provider.  Return precautions were given for any weakness or numbness in lower extremities, saddle anesthesia, loss of bowel bladder control.    Of note, patient aware of his blood pressure.  Patient reports it is elevated whenever he is in the hospital.  Patient regularly followed by PCP for hypertensive readings.  Final Clinical Impressions(s) / ED Diagnoses   Final diagnoses:  Fall  Acute left-sided low back pain without sciatica  Elevated blood pressure reading without diagnosis of hypertension    ED Discharge Orders        Ordered    methocarbamol (ROBAXIN) 500 MG tablet  2 times daily     09/27/17 1628       Tamala Julian 09/27/17 1636     Valarie Merino, MD 09/28/17 [phone]

## 2017-09-27 NOTE — Discharge Instructions (Signed)
Please see the information and instructions below regarding your visit.  Your diagnoses today include:  1. Acute left-sided low back pain without sciatica   2. Fall   3. Elevated blood pressure reading without diagnosis of hypertension    About diagnosis. Most episodes of acute low back pain are self-limited. Your exam was reassuring today that the source of your pain is not affecting the spinal cord and nerves that originate in the spinal cord.   If you have a history of disc herniation or arthritis in your spine, the nerves exiting the spine on one side get inflamed. This can cause severe pain. We call this radiculopathy. We do not always know what causes the sudden inflammation.  Tests performed today include: See side panel of your discharge paperwork for testing performed today. Vital signs are listed at the bottom of these instructions.   Medications prescribed:    Take any prescribed medications only as prescribed, and any over the counter medications only as directed on the packaging.  You are prescribed Robaxin, a muscle relaxant. Some common side effects of this medication include:  Feeling sleepy.  Dizziness. Take care upon going from a seated to a standing position.  Dry mouth.  Feeling tired or weak.  Hard stools (constipation).  Upset stomach. These are not all of the side effects that may occur. If you have questions about side effects, call your doctor. Call your primary care provider for medical advice about side effects.  This medication can be sedating. Only take this medication as needed. Please do not combine with alcohol. Do not drive or operate machinery while taking this medication.   This medication can interact with some other medications. Make sure to tell any provider you are taking this medication before they prescribe you a new medication.   You may take Tylenol, 650 mg every 6 hours as needed for back discomfort.  Please also purchase Salon PAS patches  over the counter to place on your back.   Home care instructions:   Low back pain gets worse the longer you stay stationary. Please keep moving and walking as tolerated. There are exercises included in this packet to perform as tolerated for your low back pain.   Apply heat to the areas that are painful. Avoid twisting or bending your trunk to lift something. Do not lift anything above 25 lbs while recovering from this flare of low back pain.  Please follow any educational materials contained in this packet.   Follow-up instructions: Please follow-up with your primary care provider in one week for further evaluation of your symptoms if they are not completely improved.   Return instructions:  Please return to the Emergency Department if you experience worsening symptoms.  Please return for any fever or chills in the setting of your back pain, weakness in the muscles of the legs, numbness in your legs and feet that is new or changing, numbness in the area where you wipe, retention of your urine, loss of bowel or bladder control, or problems with walking. Please return if you have any other emergent concerns.  Additional Information:   Your vital signs today were: BP (!) 151/96 (BP Location: Left Arm)    Pulse 99    Temp 98.8 F (37.1 C) (Oral)    Resp 18    Ht 5\' 9"  (1.753 m)    Wt 105.2 kg (232 lb)    SpO2 98%    BMI 34.26 kg/m  If your blood pressure (BP)  was elevated on multiple readings during this visit above 130 for the top number or above 80 for the bottom number, please have this repeated by your primary care provider within one month. --------------  Thank you for allowing [place] to participate in your care today.

## 2017-09-27 NOTE — ED Triage Notes (Signed)
Pt presents with c/o fall that occurred earlier today. Pt reports he fell out of a chair at work after the chair broke. Pt has had bilateral hip replacements. Pt is c/o left lower back pain.

## 2017-09-27 NOTE — ED Notes (Signed)
Pt in radiology 

## 2017-12-06 ENCOUNTER — Encounter: Payer: Self-pay | Admitting: Medical

## 2017-12-06 ENCOUNTER — Encounter: Payer: Self-pay | Admitting: Family Medicine

## 2017-12-06 ENCOUNTER — Ambulatory Visit (INDEPENDENT_AMBULATORY_CARE_PROVIDER_SITE_OTHER): Payer: BC Managed Care – PPO | Admitting: Medical

## 2017-12-06 ENCOUNTER — Telehealth: Payer: Self-pay

## 2017-12-06 VITALS — BP 130/80 | HR 91 | Temp 98.4°F | Resp 16 | Ht 68.0 in | Wt 246.6 lb

## 2017-12-06 DIAGNOSIS — R059 Cough, unspecified: Secondary | ICD-10-CM

## 2017-12-06 DIAGNOSIS — R05 Cough: Secondary | ICD-10-CM | POA: Diagnosis not present

## 2017-12-06 DIAGNOSIS — J988 Other specified respiratory disorders: Secondary | ICD-10-CM

## 2017-12-06 DIAGNOSIS — J3489 Other specified disorders of nose and nasal sinuses: Secondary | ICD-10-CM

## 2017-12-06 DIAGNOSIS — R6883 Chills (without fever): Secondary | ICD-10-CM | POA: Diagnosis not present

## 2017-12-06 LAB — POC INFLUENZA A&B (BINAX/QUICKVUE)
INFLUENZA B, POC: NEGATIVE
Influenza A, POC: NEGATIVE

## 2017-12-06 MED ORDER — AZITHROMYCIN 250 MG PO TABS
ORAL_TABLET | ORAL | 0 refills | Status: DC
Start: 2017-12-06 — End: 2018-06-01

## 2017-12-06 NOTE — Progress Notes (Signed)
Subjective:  Mark Valentine is a 62 y.o. male who presents for respiratory illness.    He reports 4 to 5-day history of not feeling well including chills, sweats, sore throat initially, cough, chest and head congestion, sinus pressure.  Usually gets this about twice a year in the spring and fall.  Using some over-the-counter analgesic, cough syrup.  Took 2 vacation days and then missed again yesterday for not feeling well.  denies sick contacts.   Patient is not a smoker. No other aggravating or relieving factors.  No other c/o.  Past Medical History:  Diagnosis Date  . Anal fissure 1980  . Hip pain, bilateral    pending surgery 09/2015, [facility]  . History of testicular cancer   . Hypertension 09/24/2015   no meds currently-elevates with MD visits.  . Lipoprotein deficiencies   . Low HDL (under 40)   . Osteoarthritis of hip   . Prostate cancer [facility]) 2017   robotic prostatectomy 06-04-15  . Wears glasses    Current Outpatient Medications on File Prior to Visit  Medication Sig Dispense Refill  . aspirin 325 MG tablet Take 325 mg by mouth daily.    . methocarbamol (ROBAXIN) 500 MG tablet Take 1 tablet (500 mg total) by mouth 2 (two) times daily. (Patient not taking: Reported on 12/06/2017) 10 tablet 0  . pravastatin (PRAVACHOL) 20 MG tablet Take 1 tablet (20 mg total) by mouth every evening. (Patient not taking: Reported on 12/06/2017) 90 tablet 0   No current facility-administered medications on file prior to visit.      ROS as in subjective   Objective: BP 130/80   Pulse 91   Temp 98.4 F (36.9 C) (Oral)   Resp 16   Ht 5\' 8"  (1.727 m)   Wt 246 lb 9.6 oz (111.9 kg)   SpO2 97%   BMI 37.50 kg/m   General appearance: Alert, WD/WN, no distress, ill appearing                             Skin: warm, no rash, diaphoretic                           Head: mild sinus tenderness                            Eyes: conjunctiva normal, corneas clear, PERRLA     Ears: flat TMs, external ear canals normal                          Nose: septum midline, turbinates swollen, with erythema and clear discharge             Mouth/throat: MMM, tongue normal, mild pharyngeal erythema                           Neck: supple, no adenopathy, no thyromegaly, non tender                          Heart: RRR, normal S1, S2, no murmurs                         Lungs: Bronchial breath sounds, right upper lung field with rhonchi, no wheezes, rales  Assessment  Encounter Diagnoses  Name Primary?  . Cough Yes  . Respiratory tract infection   . Chills   . Sinus pressure       Plan: Discussed supportive care, rest, hydration, gave note for work.  Can use Mucinex DM, Tylenol, saltwater gargles, warm fluids, sore throat spray over-the-counter.  Can begin Z-Pak below.  If not improving the next 72 hours or much worse than call or return.  Patient voiced understanding of diagnosis, recommendations, and treatment plan.   Davan was seen today for cough.  Diagnoses and all orders for this visit:  Cough -     POC Influenza A&B(BINAX/QUICKVUE)  Respiratory tract infection  Chills  Sinus pressure  Other orders -     azithromycin (ZITHROMAX) 250 MG tablet; 2 tablets day 1, then 1 tablet days 2-4

## 2018-06-01 ENCOUNTER — Encounter: Payer: Self-pay | Admitting: Medical

## 2018-06-01 ENCOUNTER — Other Ambulatory Visit: Payer: Self-pay

## 2018-06-01 ENCOUNTER — Ambulatory Visit (INDEPENDENT_AMBULATORY_CARE_PROVIDER_SITE_OTHER): Payer: BC Managed Care – PPO | Admitting: Medical

## 2018-06-01 VITALS — Temp 97.5°F | Resp 16 | Ht 68.0 in | Wt 253.0 lb

## 2018-06-01 DIAGNOSIS — R0602 Shortness of breath: Secondary | ICD-10-CM | POA: Diagnosis not present

## 2018-06-01 DIAGNOSIS — R05 Cough: Secondary | ICD-10-CM | POA: Diagnosis not present

## 2018-06-01 DIAGNOSIS — J988 Other specified respiratory disorders: Secondary | ICD-10-CM

## 2018-06-01 DIAGNOSIS — R059 Cough, unspecified: Secondary | ICD-10-CM

## 2018-06-01 MED ORDER — ALBUTEROL SULFATE HFA 108 (90 BASE) MCG/ACT IN AERS: 2.0000 | INHALATION_SPRAY | Freq: Four times a day (QID) | RESPIRATORY_TRACT | 0 refills | Status: DC | PRN

## 2018-06-01 NOTE — Progress Notes (Signed)
Subjective:     Patient ID: Mark Valentine, male   DOB: Apr 17, 1955, 63 y.o.   MRN: [id number]  Interactive audio and video telecommunications were attempted between this provider and patient, however due to patient not able to access video we continued and completed visit with audio only.  The patient was located at home. The provider was located in the office. The patient did consent to this visit and is aware of possible charges through their insurance for this visit.  The other persons participating in this telemedicine service were none. Time spent on call was 15 minutes and in review of previous records >20 minutes total.  This virtual service is not related to other E/M service within previous 7 days.   HPI Chief Complaint  Patient presents with  . SOB    SOB, Chest tightness, coughing, bodyaches, joint pain, sweats on and off , weakness X    He reports 3-4 day hx/o cough, intermittent mild cough.   Has some sneezing occasionally, feels some chest congestion, some hoarseness, some sore throat.    No change in sense of smell or taste.   He notes 1 chest tightness, body aches, some joint pains, some sweats.   No wheezing.   Having some SOB.  No fever, no chills.  Using elderberry, zyrtec for several days and started mucinex yesterday.  No known +coronavirus contact, but works at [employer], around folks.   Has had some reactive airway all through his life particularly with pollen, but no specific diagnosis of asthma.   No NVD, no chest pain.  No other aggravating or relieving factors. No other complaint.   Past Medical History:  Diagnosis Date  . Anal fissure 1980  . Hip pain, bilateral    pending surgery [facility]/2015,  [facility]  . History of testicular cancer   . Hypertension 09/24/2015   no meds currently-elevates with MD visits.  . Lipoprotein deficiencies   . Low HDL (under 40)   . Osteoarthritis of hip   . Prostate cancer [facility]) 2017   robotic prostatectomy 06-04-15  . Wears  glasses    Current Outpatient Medications on File Prior to Visit  Medication Sig Dispense Refill  . aspirin 325 MG tablet Take 325 mg by mouth daily.    Marland Kitchen dextromethorphan-guaiFENesin (MUCINEX DM) 30-600 MG 12hr tablet Take 1 tablet by mouth 2 (two) times daily.     No current facility-administered medications on file prior to visit.    Review of Systems As in subjective     Objective:   Physical Exam Temp (!) 97.5 F (36.4 C) (Oral)   Resp 16   Ht 5\' 8"  (1.727 m)   Wt 253 lb (114.8 kg)   BMI 38.47 kg/m   Due to coronavirus pandemic stay at home measures, patient visit was virtual and they were not examined in person.   He was coughing some, but able to speak in clear sentences.  No obvious distress.      Assessment:     Encounter Diagnoses  Name Primary?  . Cough Yes  . Respiratory tract infection   . SOB (shortness of breath)        Plan:     Discussed limitations of tele visit/virtual visit that I can't fully examine him or give definitive diagnosis.   Discussed likely diagnosis of URI vs coronavirus type illness. Discussed supportive care including rest, hydration, OTC Tylenol for fever, aches, and malaise.  Can c/t zyrtec, mucinex, and begin albuterol.  discussed proper  use, begin 2 puffs TID the next several days.   Discussed period of contagion, self quarantine at home away from others to avoid spread of disease.  Discussed the need to be symptom free for 72 hours including no fever before going back to work or interacting with others.  Discussed the need to be quarantined at least 7 days after start of illness.  Discussed means of transmission, and possible complications including pneumonia and/or respiratory distress.  If worse or not improving within the next 4-5 days, then call or return.  Advised this dehydration or respiratory distress or worsening shortness of breath over the next several days then report to the emergency department   Patient voiced  understanding of diagnosis, recommendations, and treatment plan.  Gave note for work.

## 2018-09-22 IMAGING — DX DG HIP (WITH OR WITHOUT PELVIS) 3-4V BILAT
5 series · 5 of 5 positions shown · non-contrast
Comparison: AP view of the pelvis of November 11, 2015

CLINICAL DATA: The patient fell earlier today from a chair after
the chest broke. History of bilateral hip replacement. The patient
reports left lower back pain.

EXAM:
DG HIP (WITH OR WITHOUT PELVIS) 3-4V BILAT

[hip ap (1 of 2)]
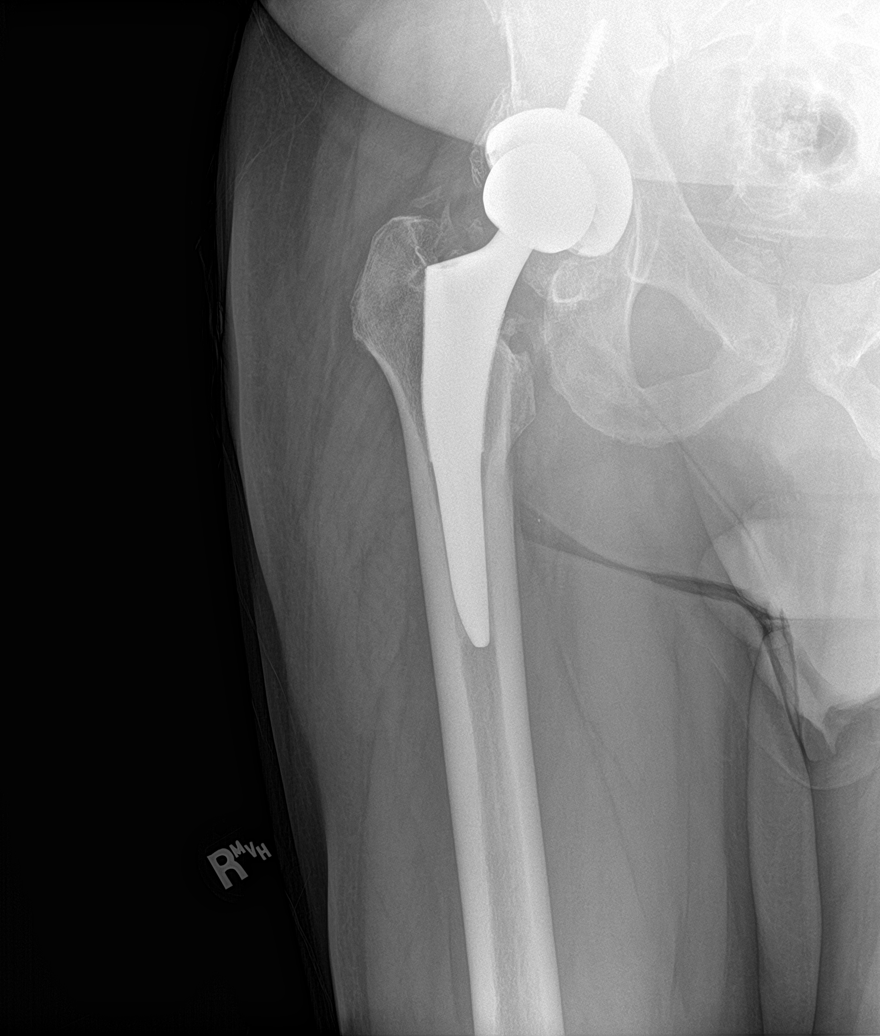

[hip lat (1 of 2)]
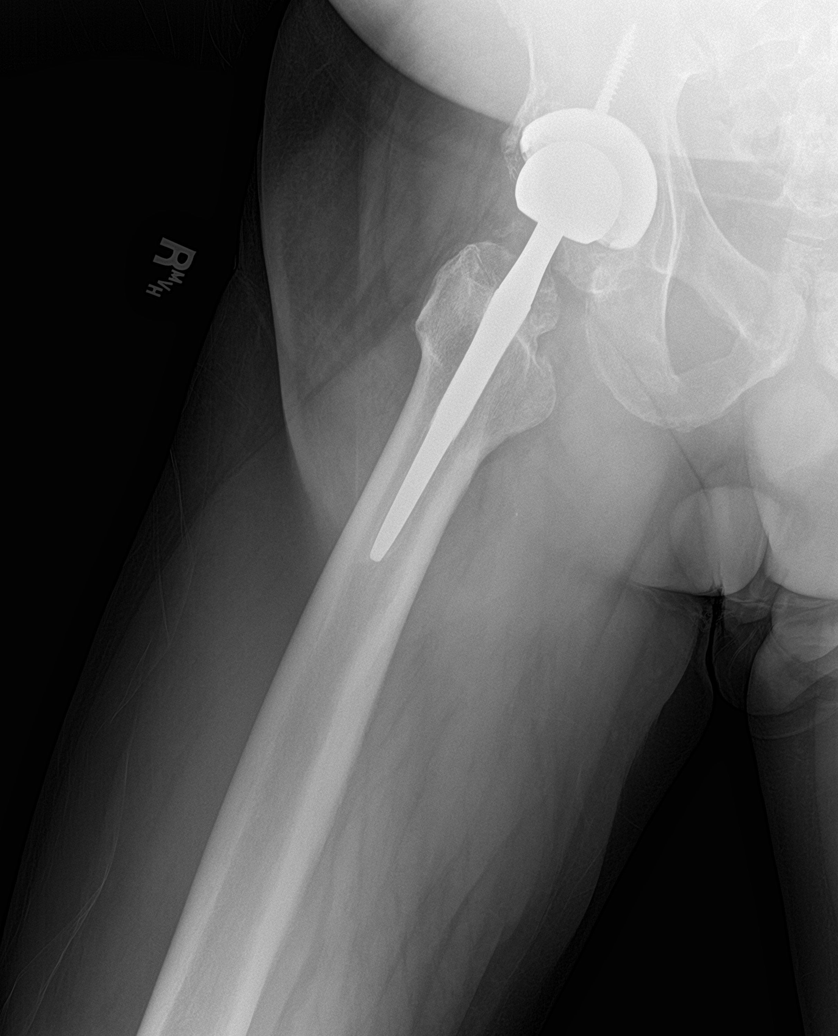

[hip ap (2 of 2)]
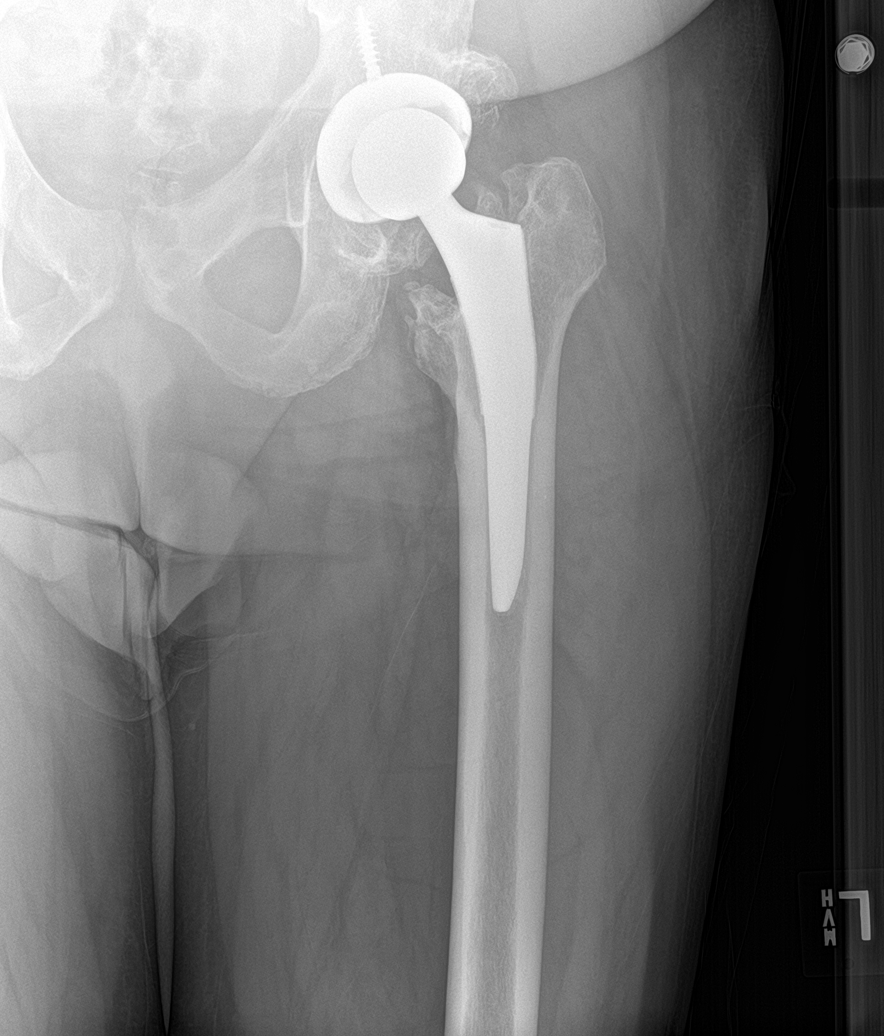

[hip lat (2 of 2)]
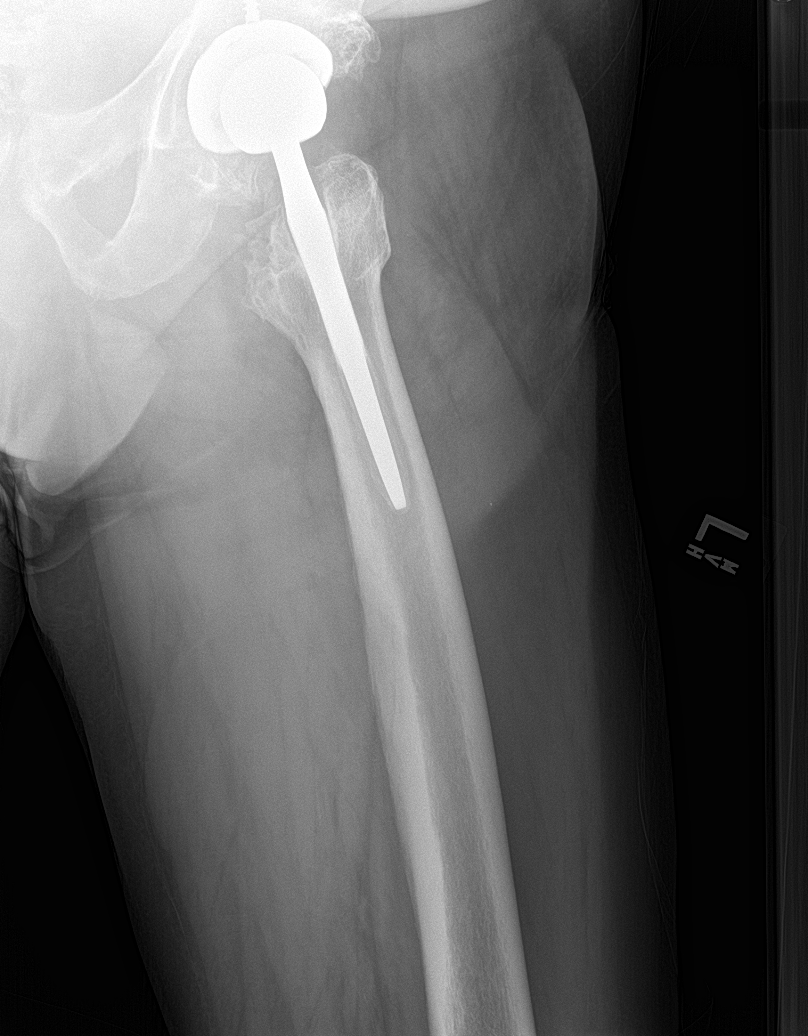

[pelvis ap]
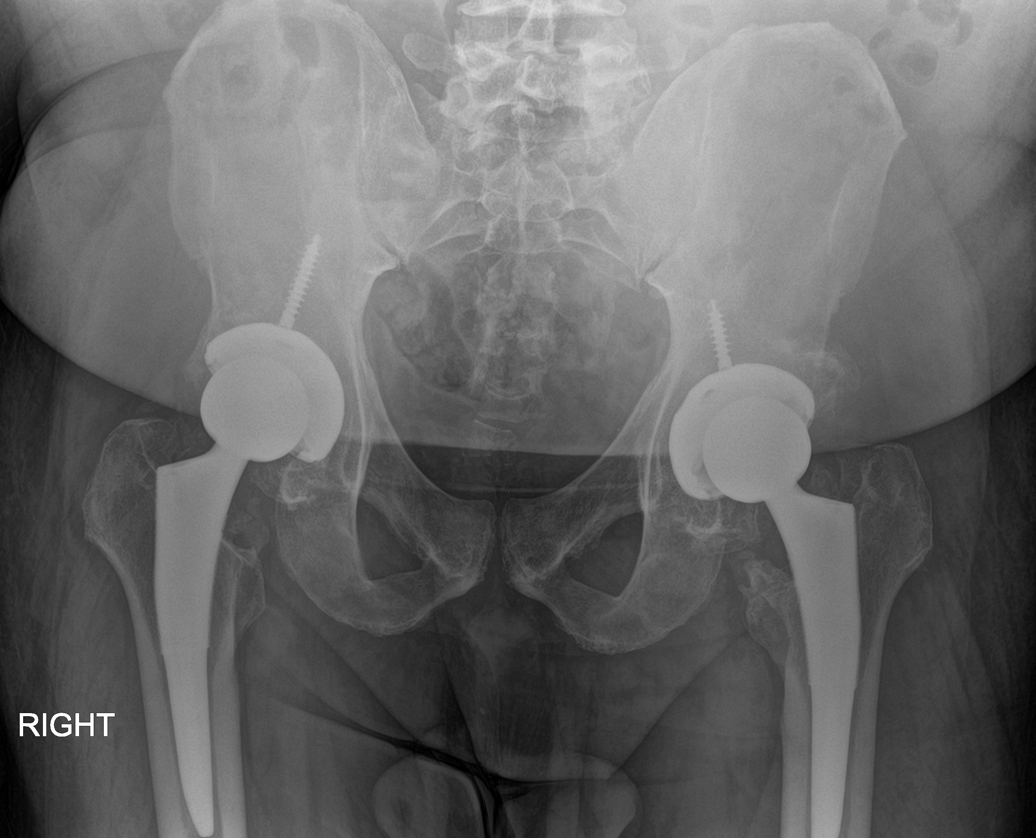

[5 of 5 positions shown; findings below may reference images not displayed]

FINDINGS: The bony pelvis is subjectively adequately mineralized. There is no
acute or healing fracture.

AP and lateral views of both prosthetic hips reveal normal interface
of the prosthetic components with the native bone. No acute native
bone fracture is observed. The overlying soft tissues are
unremarkable.
IMPRESSION: No acute fracture nor dislocation of the prosthetic hips nor acute
bony abnormality of the pelvis.

## 2019-07-24 ENCOUNTER — Encounter: Payer: BC Managed Care – PPO | Admitting: Medical

## 2019-08-15 ENCOUNTER — Ambulatory Visit (INDEPENDENT_AMBULATORY_CARE_PROVIDER_SITE_OTHER): Payer: BC Managed Care – PPO | Admitting: Medical

## 2019-08-15 ENCOUNTER — Other Ambulatory Visit: Payer: Self-pay

## 2019-08-15 ENCOUNTER — Encounter: Payer: Self-pay | Admitting: Medical

## 2019-08-15 VITALS — BP 152/96 | HR 81 | Ht 68.0 in | Wt 248.0 lb

## 2019-08-15 DIAGNOSIS — Z1211 Encounter for screening for malignant neoplasm of colon: Secondary | ICD-10-CM

## 2019-08-15 DIAGNOSIS — I10 Essential (primary) hypertension: Secondary | ICD-10-CM | POA: Diagnosis not present

## 2019-08-15 DIAGNOSIS — E669 Obesity, unspecified: Secondary | ICD-10-CM

## 2019-08-15 DIAGNOSIS — Z0000 Encounter for general adult medical examination without abnormal findings: Secondary | ICD-10-CM

## 2019-08-15 DIAGNOSIS — Z96642 Presence of left artificial hip joint: Secondary | ICD-10-CM | POA: Diagnosis not present

## 2019-08-15 DIAGNOSIS — M169 Osteoarthritis of hip, unspecified: Secondary | ICD-10-CM

## 2019-08-15 DIAGNOSIS — Z1159 Encounter for screening for other viral diseases: Secondary | ICD-10-CM

## 2019-08-15 DIAGNOSIS — R7301 Impaired fasting glucose: Secondary | ICD-10-CM

## 2019-08-15 DIAGNOSIS — Z131 Encounter for screening for diabetes mellitus: Secondary | ICD-10-CM

## 2019-08-15 DIAGNOSIS — Z7189 Other specified counseling: Secondary | ICD-10-CM | POA: Diagnosis not present

## 2019-08-15 DIAGNOSIS — Z7185 Encounter for immunization safety counseling: Secondary | ICD-10-CM

## 2019-08-15 DIAGNOSIS — E782 Mixed hyperlipidemia: Secondary | ICD-10-CM

## 2019-08-15 DIAGNOSIS — Z136 Encounter for screening for cardiovascular disorders: Secondary | ICD-10-CM | POA: Diagnosis not present

## 2019-08-15 LAB — POCT URINALYSIS DIP (PROADVANTAGE DEVICE)
Bilirubin, UA: NEGATIVE
Blood, UA: NEGATIVE
Glucose, UA: NEGATIVE mg/dL
Ketones, POC UA: NEGATIVE mg/dL
Leukocytes, UA: NEGATIVE
Nitrite, UA: NEGATIVE
Protein Ur, POC: NEGATIVE mg/dL
Specific Gravity, Urine: 1.015
Urobilinogen, Ur: NEGATIVE
pH, UA: 6 (ref 5.0–8.0)

## 2019-08-15 MED ORDER — OLMESARTAN MEDOXOMIL 20 MG PO TABS: 20.0000 mg | ORAL_TABLET | Freq: Every day | ORAL | 3 refills | Status: DC

## 2019-08-15 MED ORDER — ROSUVASTATIN CALCIUM 10 MG PO TABS: 10.0000 mg | ORAL_TABLET | Freq: Every day | ORAL | 3 refills | Status: DC

## 2019-08-15 NOTE — Progress Notes (Signed)
Subjective:   HPI  Mark Valentine is a 64 y.o. male who presents for a complete physical.   Medical care team includes:  Dr. Philipp Ovens, dentist  Eye doctor  Urology, Dr. Morrison Old  Dr. Paralee Cancel, orthopedist  Dorothea Ogle, PA-C along with Dr. Redmond School here for primary care   Prior vaccinations: TD or Tdap:9 years ago  Influenza: yes gets every year Pneumococcal: n/a Shingles/Zostavax:n/a  He just had covid vaccine recently   Concerns: History of prostate cancer.   Sees Urology, Dr. Rosana Hoes, sees this month  S/p robotic prostatectomy in April 2017  Reviewed their medical, surgical, family, social, medication, and allergy history and updated chart as appropriate.  Past Medical History:  Diagnosis Date  . Anal fissure 1980  . Hip pain, bilateral    pending surgery 09/2015, [facility]  . History of testicular cancer   . Hypertension 09/24/2015   no meds currently-elevates with MD visits.  . Lipoprotein deficiencies   . Low HDL (under 40)   . Osteoarthritis of hip   . Prostate cancer [facility]) 06-05-2015   robotic prostatectomy 06-04-15  . Wears glasses     Past Surgical History:  Procedure Laterality Date  . anal fistula repair  1980's  . COLONOSCOPY  02/01/11   normal, repeat 2020-06-04; Dr. Sharlett Iles  . HIP CLOSED REDUCTION Left 11/11/2015   Procedure: CLOSED REDUCTION HIP;  Surgeon: Paralee Cancel, MD;  Location: [facility];  Service: Orthopedics;  Laterality: Left;  . ORCHIECTOMY  1997   prostate cancer- one testicle only  . PROSTATE BIOPSY  Jun 04, 2008   Dr. Amalia Hailey, Urology  . PROSTATECTOMY  05/2015   robotic  . TOTAL HIP ARTHROPLASTY Right 09/30/2015   Procedure: RIGHT TOTAL HIP ARTHROPLASTY ANTERIOR APPROACH;  Surgeon: Paralee Cancel, MD;  Location: [facility];  Service: Orthopedics;  Laterality: Right;  . TOTAL HIP ARTHROPLASTY Left 11/11/2015   Procedure: LEFT TOTAL HIP ARTHROPLASTY ANTERIOR APPROACH;  Surgeon: Paralee Cancel, MD;  Location: [facility];  Service: Orthopedics;   Laterality: Left;    Social History   Socioeconomic History  . Marital status: Married    Spouse name: Not on file  . Number of children: 1  . Years of education: Not on file  . Highest education level: Not on file  Occupational History  . Not on file  Tobacco Use  . Smoking status: Never Smoker  . Smokeless tobacco: Never Used  Substance and Sexual Activity  . Alcohol use: No  . Drug use: No  . Sexual activity: Yes  Other Topics Concern  . Not on file  Social History Narrative   Married, limited exercise, former judge.  Worked for [employer], prior worked at [employer] in [place].  First wife passed away 06-05-1999.  Has 1 daugthter.  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  Mother passed Jun 05, 2015.   07/2019   Social Determinants of Health   Financial Resource Strain:   . Difficulty of Paying Living Expenses:   Food Insecurity:   . Worried About Charity fundraiser in the Last Year:   . Arboriculturist in the Last Year:   Transportation Needs:   . Film/video editor (Medical):   Marland Kitchen Lack of Transportation (Non-Medical):   Physical Activity:   . Days of Exercise per Week:   . Minutes of Exercise per Session:   Stress:   . Feeling of Stress :   Social Connections:   . Frequency of Communication with Friends and Family:   .  Frequency of Social Gatherings with Friends and Family:   . Attends Religious Services:   . Active Member of Clubs or Organizations:   . Attends Archivist Meetings:   Marland Kitchen Marital Status:   Intimate Partner Violence:   . Fear of Current or Ex-Partner:   . Emotionally Abused:   Marland Kitchen Physically Abused:   . Sexually Abused:     Family History  Problem Relation Age of Onset  . Arthritis Mother   . Diabetes Mother   . Stroke Mother   . Hypertension Mother   . Heart disease Father        aortic disease, possible dissection  . Hypertension Sister   . Mental illness Sister   . Diabetes Sister   . Hypertension Brother      Current Outpatient  Medications:  .  olmesartan (BENICAR) 20 MG tablet, Take 1 tablet (20 mg total) by mouth daily., Disp: 90 tablet, Rfl: 3 .  rosuvastatin (CRESTOR) 10 MG tablet, Take 1 tablet (10 mg total) by mouth daily., Disp: 90 tablet, Rfl: 3  Allergies  Allergen Reactions  . Mobic [Meloxicam] Other (See Comments)    bleeding      Review of Systems Constitutional: -fever, -chills, -sweats, -unexpected weight change, -decreased appetite, -fatigue Allergy: -sneezing, -itching, -congestion Dermatology: -changing moles, --rash, -lumps ENT: -runny nose, -ear pain, -sore throat, -hoarseness, -sinus pain, -teeth pain, - ringing in ears, -hearing loss, -nosebleeds Cardiology: -chest pain, -palpitations, -swelling, -difficulty breathing when lying flat, -waking up short of breath Respiratory: -cough, -shortness of breath, -difficulty breathing with exercise or exertion, -wheezing, -coughing up blood Gastroenterology: -abdominal pain, -nausea, -vomiting, -diarrhea, -constipation, -blood in stool, -changes in bowel movement, -difficulty swallowing or eating Hematology: -bleeding, -bruising  Musculoskeletal: +joint aches, -muscle aches, -joint swelling, -back pain, -neck pain, -cramping, -changes in gait Ophthalmology: denies vision changes, eye redness, itching, discharge Urology: -burning with urination, -difficulty urinating, -blood in urine, -urinary frequency, -urgency, -incontinence Neurology: -headache, -weakness, -tingling, -numbness, -memory loss, -falls, -dizziness Psychology: -depressed mood, -agitation, -sleep problems     Objective:   Physical Exam  BP (!) 152/96   Pulse 81   Ht 5\' 8"  (1.727 m)   Wt 248 lb (112.5 kg)   SpO2 95%   BMI 37.71 kg/m     General appearence: alert, no distress, WD/WN, African American male Neck: supple, no lymphadenopathy, no thyromegaly, no masses, no bruits Heart: RRR, normal S1, S2, no murmurs Lungs: CTA bilaterally, no wheezes, rhonchi, or rales Abdomen:  +bs, soft, non tender, non distended, no masses, no hepatomegaly, no splenomegaly Back: non tender Musculoskeletal: limited exam, nontender, no swelling, no obvious deformity Extremities: no edema, no cyanosis, no clubbing Pulses: 2+ symmetric, upper and lower extremities, normal cap refill Neurological: alert, oriented x 3, CN2-12 intact, strength normal upper extremities and lower extremities, sensation normal throughout, DTRs 2+ throughout, no cerebellar signs, gait normal Psychiatric: normal affect, behavior normal, pleasant  GU/rectal - declined  EKG  indication physical and screening, rate 67 bpm, PR 174 ms, QRS 82 ms, QTC 399 ms, axis 57 degrees, normal sinus rhythm, nonspecific T wave abnormality, no acute change    Assessment and Plan :    Encounter Diagnoses  Name Primary?  . Encounter for health maintenance examination in adult Yes  . Vaccine counseling   . Status post left hip replacement   . Obesity with serious comorbidity, unspecified classification, unspecified obesity type   . Screen for colon cancer   . Mixed dyslipidemia   .  Osteoarthritis of hip, unspecified laterality, unspecified osteoarthritis type   . Essential hypertension   . Impaired fasting blood sugar   . Screening for heart disease   . Screening for diabetes mellitus   . Encounter for hepatitis C screening test for low risk patient    Physical exam - discussed healthy lifestyle, diet, exercise, preventative care, vaccinations, and addressed their concerns.   Advised they see a dentist yearly for routine dental care including hygiene visits twice yearly. Advised they see an eye doctor yearly for routine vision care. Routine screening labs today  Vaccinations: Counseled on Shingrix, pneumococcal and Tdap.  He will consider and check insurance coverage He recently had covid vaccine   Other concerns today: Obesity - work on lifestyle changes and weight loss  Hypertension - restart medication.   Discussed risks of uncontrolled HTN  Heart disease screening - consider CT calcium score  Mixed dyslipidemia - labs today, restart medication.    Colon cancer screening - due for colonoscopy 2022  prostate cancer -  Managed by urology  Impaired glucose - labs today  Follow up pending labs  Dushaun was seen today for annual exam.  Diagnoses and all orders for this visit:  Encounter for health maintenance examination in adult -     Comprehensive metabolic panel -     CBC with Differential/Platelet -     Lipid panel -     Hepatitis C antibody -     Hemoglobin A1c -     EKG 12-Lead -     POCT Urinalysis DIP (Proadvantage Device)  Vaccine counseling  Status post left hip replacement  Obesity with serious comorbidity, unspecified classification, unspecified obesity type  Screen for colon cancer  Mixed dyslipidemia -     Comprehensive metabolic panel -     Lipid panel  Osteoarthritis of hip, unspecified laterality, unspecified osteoarthritis type  Essential hypertension -     EKG 12-Lead  Impaired fasting blood sugar -     Hemoglobin A1c  Screening for heart disease -     EKG 12-Lead  Screening for diabetes mellitus -     Hemoglobin A1c  Encounter for hepatitis C screening test for low risk patient -     Hepatitis C antibody  Other orders -     rosuvastatin (CRESTOR) 10 MG tablet; Take 1 tablet (10 mg total) by mouth daily. -     olmesartan (BENICAR) 20 MG tablet; Take 1 tablet (20 mg total) by mouth daily.   F/u yearly for physical, pending labs

## 2019-08-16 ENCOUNTER — Encounter: Payer: Self-pay | Admitting: Medical

## 2019-08-16 LAB — CBC WITH DIFFERENTIAL/PLATELET
Basophils Absolute: 0.1 10*3/uL (ref 0.0–0.2)
Basos: 1 %
EOS (ABSOLUTE): 0.3 10*3/uL (ref 0.0–0.4)
Eos: 3 %
Hematocrit: 47.1 % (ref 37.5–51.0)
Hemoglobin: 16.3 g/dL (ref 13.0–17.7)
Immature Grans (Abs): 0 10*3/uL (ref 0.0–0.1)
Immature Granulocytes: 0 %
Lymphocytes Absolute: 3.6 10*3/uL — ABNORMAL HIGH (ref 0.7–3.1)
Lymphs: 43 %
MCH: 31.3 pg (ref 26.6–33.0)
MCHC: 34.6 g/dL (ref 31.5–35.7)
MCV: 90 fL (ref 79–97)
Monocytes Absolute: 0.8 10*3/uL (ref 0.1–0.9)
Monocytes: 9 %
Neutrophils Absolute: 3.7 10*3/uL (ref 1.4–7.0)
Neutrophils: 44 %
Platelets: 230 10*3/uL (ref 150–450)
RBC: 5.21 x10E6/uL (ref 4.14–5.80)
RDW: 12.6 % (ref 11.6–15.4)
WBC: 8.4 10*3/uL (ref 3.4–10.8)

## 2019-08-16 LAB — LIPID PANEL
Chol/HDL Ratio: 6.8 ratio — ABNORMAL HIGH (ref 0.0–5.0)
Cholesterol, Total: 164 mg/dL (ref 100–199)
HDL: 24 mg/dL — ABNORMAL LOW (ref >39)
LDL Chol Calc (NIH): 110 mg/dL — ABNORMAL HIGH (ref 0–99)
Triglycerides: 169 mg/dL — ABNORMAL HIGH (ref 0–149)
VLDL Cholesterol Cal: 30 mg/dL (ref 5–40)

## 2019-08-16 LAB — COMPREHENSIVE METABOLIC PANEL
ALT: 29 IU/L (ref 0–44)
AST: 21 IU/L (ref 0–40)
Albumin/Globulin Ratio: 1.4 (ref 1.2–2.2)
Albumin: 4.2 g/dL (ref 3.8–4.8)
Alkaline Phosphatase: 58 IU/L (ref 48–121)
BUN/Creatinine Ratio: 9 — ABNORMAL LOW (ref 10–24)
BUN: 11 mg/dL (ref 8–27)
Bilirubin Total: 0.9 mg/dL (ref 0.0–1.2)
CO2: 24 mmol/L (ref 20–29)
Calcium: 9.5 mg/dL (ref 8.6–10.2)
Chloride: 100 mmol/L (ref 96–106)
Creatinine, Ser: 1.21 mg/dL (ref 0.76–1.27)
GFR calc Af Amer: 73 mL/min/{1.73_m2} (ref >59)
GFR calc non Af Amer: 63 mL/min/{1.73_m2} (ref >59)
Globulin, Total: 2.9 g/dL (ref 1.5–4.5)
Glucose: 97 mg/dL (ref 65–99)
Potassium: 4.6 mmol/L (ref 3.5–5.2)
Sodium: 142 mmol/L (ref 134–144)
Total Protein: 7.1 g/dL (ref 6.0–8.5)

## 2019-08-16 LAB — HEMOGLOBIN A1C
Est. average glucose Bld gHb Est-mCnc: 126 mg/dL
Hgb A1c MFr Bld: 6 % — ABNORMAL HIGH (ref 4.8–5.6)

## 2019-08-16 LAB — HEPATITIS C ANTIBODY: Hep C Virus Ab: <0.1 s/co ratio (ref 0.0–0.9)

## 2019-08-28 ENCOUNTER — Telehealth: Payer: Self-pay | Admitting: Medical

## 2019-08-28 ENCOUNTER — Other Ambulatory Visit: Payer: Self-pay | Admitting: Medical

## 2019-08-28 NOTE — Telephone Encounter (Signed)
Pt called and states that his bp was 143/82 last night and he states that was the first time he has checked it since he was here, he has been taking his bp medicine in the morning and every day like he should be and has been trying to watch what he eats, he wants to know if you think he needs to take more of the bp medicine, and if there is a better time to take his bp than at night, and if is bp should be better than 143/82. Pt can be reached at [phone]

## 2019-08-28 NOTE — Telephone Encounter (Signed)
Patient has been informed of provider message.

## 2019-08-28 NOTE — Telephone Encounter (Signed)
I recommend he take his blood pressure medicine in the morning not at night.  Have him switch to morning and if blood pressures are not less than 130/80 within the next week and 1/2 to 2 weeks, we can increase to 40 mg daily.  I had recently put 90-day supply to the pharmacy so we would need to change the prescription.  Have him call back in a week or 2 and let me know if he is increase to the 40 mg or 2x20 mg tablets daily

## 2019-11-03 ENCOUNTER — Other Ambulatory Visit: Payer: Self-pay | Admitting: Medical

## 2019-11-06 NOTE — Telephone Encounter (Signed)
Is this medication change ok? Please advise

## 2020-02-07 ENCOUNTER — Other Ambulatory Visit: Payer: Self-pay | Admitting: Medical

## 2020-05-19 ENCOUNTER — Other Ambulatory Visit: Payer: Self-pay | Admitting: Medical

## 2020-06-09 ENCOUNTER — Other Ambulatory Visit: Payer: Self-pay | Admitting: Medical

## 2020-08-12 ENCOUNTER — Other Ambulatory Visit: Payer: Self-pay | Admitting: Medical

## 2020-09-09 ENCOUNTER — Other Ambulatory Visit: Payer: Self-pay

## 2020-09-09 MED ORDER — LOSARTAN POTASSIUM 50 MG PO TABS: 50.0000 mg | ORAL_TABLET | Freq: Every day | ORAL | 0 refills | Status: DC

## 2020-10-03 ENCOUNTER — Other Ambulatory Visit: Payer: Self-pay

## 2020-10-03 ENCOUNTER — Ambulatory Visit: Payer: BC Managed Care – PPO | Admitting: Medical

## 2020-10-03 ENCOUNTER — Encounter: Payer: Self-pay | Admitting: Medical

## 2020-10-03 VITALS — BP 120/80 | HR 79 | Ht 68.0 in | Wt 245.0 lb

## 2020-10-03 DIAGNOSIS — I10 Essential (primary) hypertension: Secondary | ICD-10-CM | POA: Diagnosis not present

## 2020-10-03 DIAGNOSIS — R7301 Impaired fasting glucose: Secondary | ICD-10-CM | POA: Diagnosis not present

## 2020-10-03 DIAGNOSIS — Z0000 Encounter for general adult medical examination without abnormal findings: Secondary | ICD-10-CM

## 2020-10-03 DIAGNOSIS — Z1211 Encounter for screening for malignant neoplasm of colon: Secondary | ICD-10-CM

## 2020-10-03 DIAGNOSIS — M169 Osteoarthritis of hip, unspecified: Secondary | ICD-10-CM | POA: Diagnosis not present

## 2020-10-03 DIAGNOSIS — Z7185 Encounter for immunization safety counseling: Secondary | ICD-10-CM

## 2020-10-03 DIAGNOSIS — E782 Mixed hyperlipidemia: Secondary | ICD-10-CM

## 2020-10-03 DIAGNOSIS — Z96642 Presence of left artificial hip joint: Secondary | ICD-10-CM

## 2020-10-03 DIAGNOSIS — Z6837 Body mass index (BMI) 37.0-37.9, adult: Secondary | ICD-10-CM

## 2020-10-03 DIAGNOSIS — Z136 Encounter for screening for cardiovascular disorders: Secondary | ICD-10-CM

## 2020-10-03 MED ORDER — LOSARTAN POTASSIUM 50 MG PO TABS: 50.0000 mg | ORAL_TABLET | Freq: Every day | ORAL | 3 refills | Status: DC

## 2020-10-03 NOTE — Patient Instructions (Signed)
This visit was a preventative care visit, also known as wellness visit or routine physical.   Topics typically include healthy lifestyle, diet, exercise, preventative care, vaccinations, sick and well care, proper use of emergency dept and after hours care, as well as other concerns.     Recommendations: Continue to return yearly for your annual wellness and preventative care visits.  This gives [place] a chance to discuss healthy lifestyle, exercise, vaccinations, review your chart record, and perform screenings where appropriate.  I recommend you see your eye doctor yearly for routine vision care.  I recommend you see your dentist yearly for routine dental care including hygiene visits twice yearly.   Vaccination recommendations were reviewed Immunization History  Administered Date(s) Administered   Influenza Split 12/04/2010   Influenza,inj,Quad PF,6+ Mos 12/09/2015, 01/22/2017   Influenza-Unspecified 01/22/2017   PFIZER Comirnaty(Gray Top)Covid-19 Tri-Sucrose Vaccine 07/04/2020   PFIZER(Purple Top)SARS-COV-2 Vaccination 01/03/2020   I recommend yearly flu shot in the fall  Shingles vaccine:  I recommend you have a shingles vaccine to help prevent shingles or herpes zoster outbreak.   Please call your insurer to inquire about coverage for the Shingrix vaccine given in 2 doses.   Some insurers cover this vaccine after age 35, some cover this after age 28.  If your insurer covers this, then call to schedule appointment to have this vaccine here.  Check insurance also about tdap tetanus diptheria pertussis booster.  Please get [place] copy of your other covid vaccines.    Screening for cancer: Colon cancer screening: Consider Cologuard vs colonoscopy.  Please call your insurance company to check coverage for colon cancer screening.  Options may include Cologuard stool test or Colonoscopy.  You should also inquire about which facility the colonoscopy could be performed, and coverage for  diagnostic vs screening colonoscopy as coverage may vary.  If you have significant family history of colon cancer or blood in the stool, then you should only do the colonoscopy, not the Cologuard test.  We discussed PSA, prostate exam, and prostate cancer screening risks/benefits.     Skin cancer screening: Check your skin regularly for new changes, growing lesions, or other lesions of concern Come in for evaluation if you have skin lesions of concern.  Lung cancer screening: If you have a greater than 20 pack year history of tobacco use, then you may qualify for lung cancer screening with a chest CT scan.   Please call your insurance company to inquire about coverage for this test.  We currently don't have screenings for other cancers besides breast, cervical, colon, and lung cancers.  If you have a strong family history of cancer or have other cancer screening concerns, please let me know.    Bone health: Get at least 150 minutes of aerobic exercise weekly Get weight bearing exercise at least once weekly Bone density test:  A bone density test is an imaging test that uses a type of X-ray to measure the amount of calcium and other minerals in your bones. The test may be used to diagnose or screen you for a condition that causes weak or thin bones (osteoporosis), predict your risk for a broken bone (fracture), or determine how well your osteoporosis treatment is working. The bone density test is recommended for females 62 and older, or females or males [id number] if certain risk factors such as thyroid disease, long term use of steroids such as for asthma or rheumatological issues, vitamin D deficiency, estrogen deficiency, family history of osteoporosis, self or family  history of fragility fracture in first degree relative.    Heart health: Get at least 150 minutes of aerobic exercise weekly Limit alcohol It is important to maintain a healthy blood pressure and healthy cholesterol  numbers  Heart disease screening: Screening for heart disease includes screening for blood pressure, fasting lipids, glucose/diabetes screening, BMI height to weight ratio, reviewed of smoking status, physical activity, and diet.    Goals include blood pressure 120/80 or less, maintaining a healthy lipid/cholesterol profile, preventing diabetes or keeping diabetes numbers under good control, not smoking or using tobacco products, exercising most days per week or at least 150 minutes per week of exercise, and eating healthy variety of fruits and vegetables, healthy oils, and avoiding unhealthy food choices like fried food, fast food, high sugar and high cholesterol foods.    Other tests may possibly include EKG test, CT coronary calcium score, echocardiogram, exercise treadmill stress test.     Medical care options: I recommend you continue to seek care here first for routine care.  We try really hard to have available appointments Monday through Friday daytime hours for sick visits, acute visits, and physicals.  Urgent care should be used for after hours and weekends for significant issues that cannot wait till the next day.  The emergency department should be used for significant potentially life-threatening emergencies.  The emergency department is expensive, can often have long wait times for less significant concerns, so try to utilize primary care, urgent care, or telemedicine when possible to avoid unnecessary trips to the emergency department.  Virtual visits and telemedicine have been introduced since the pandemic started in 2020, and can be convenient ways to receive medical care.  We offer virtual appointments as well to assist you in a variety of options to seek medical care.    Separate significant issues discussed: Obesity - work on lifestyle changes and weight loss  Hypertension - continue current medication  Heart disease screening - consider CT calcium score  Mixed dyslipidemia  - labs today, continue current medication  History of prostate cancer -  Managed by urology  Impaired glucose - labs today

## 2020-10-03 NOTE — Progress Notes (Signed)
Subjective:   HPI  Mark Valentine is a 65 y.o. male who presents for a complete physical.   Medical care team includes: Dr. Philipp Ovens, dentist [facility], Dr. Tresa Endo, [facility] Dr. Paralee Cancel, orthopedist Dorothea Ogle, PA-C along with Dr. Redmond School here for primary care   Concerns: History of prostate cancer.   Sees Urology, Dr. Rosana Hoes.  S/p robotic prostatectomy in April 2017.  History of testicular cancer and prior radiation therapy  Compliant with medications for blood pressure and cholesterol   Reviewed their medical, surgical, family, social, medication, and allergy history and updated chart as appropriate.  Past Medical History:  Diagnosis Date   Anal fissure 1980   Hip pain, bilateral    pending surgery 09/2015, [facility]   History of testicular cancer    Hypertension 09/24/2015   no meds currently-elevates with MD visits.   Lipoprotein deficiencies    Low HDL (under 40)    Osteoarthritis of hip    Prostate cancer ([place]) 12-Jun-2015   robotic prostatectomy 06-04-15   Wears glasses     Past Surgical History:  Procedure Laterality Date   anal fistula repair  1980's   COLONOSCOPY  02/01/11   normal, repeat June 11, 2020; Dr. Sharlett Iles   HIP CLOSED REDUCTION Left 11/11/2015   Procedure: CLOSED REDUCTION HIP;  Surgeon: Paralee Cancel, MD;  Location: [facility];  Service: Orthopedics;  Laterality: Left;   ORCHIECTOMY  1997   prostate cancer- one testicle only   PROSTATE BIOPSY  06/11/2008   Dr. Amalia Hailey, Urology   PROSTATECTOMY  05/2015   robotic   TOTAL HIP ARTHROPLASTY Right 09/30/2015   Procedure: RIGHT TOTAL HIP ARTHROPLASTY ANTERIOR APPROACH;  Surgeon: Paralee Cancel, MD;  [facility]: [facility];  Service: Orthopedics;  Laterality: Right;   TOTAL HIP ARTHROPLASTY Left 11/11/2015   Procedure: LEFT TOTAL HIP ARTHROPLASTY ANTERIOR APPROACH;  Surgeon: Paralee Cancel, MD;  [facility];  Service: Orthopedics;  Laterality: Left;    Social History   Socioeconomic History    Marital status: Married    Spouse name: Not on file   Number of children: 1   Years of education: Not on file   Highest education level: Not on file  Occupational History   Not on file  Tobacco Use   Smoking status: Never   Smokeless tobacco: Never  Substance and Sexual Activity   Alcohol use: No   Drug use: No   Sexual activity: Yes  Other Topics Concern   Not on file  Social History Narrative   Married, limited exercise, former judge.  Worked for [employer], prior worked at [employer] in [place].  First wife passed away 06-12-99.  Has 1 daugthter.  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  Mother passed 06-12-15.   07/2019   Social Determinants of Health   Financial Resource Strain: Not on file  Food Insecurity: Not on file  Transportation Needs: Not on file  Physical Activity: Not on file  Stress: Not on file  Social Connections: Not on file  Intimate Partner Violence: Not on file    Family History  Problem Relation Age of Onset   Arthritis Mother    Diabetes Mother    Stroke Mother    Hypertension Mother    Heart disease Father        aortic disease, possible dissection   Hypertension Sister    Mental illness Sister    Diabetes Sister    Hypertension Brother      Current Outpatient Medications:  losartan (COZAAR) 50 MG tablet, Take 1 tablet (50 mg total) by mouth daily., Disp: 90 tablet, Rfl: 0   rosuvastatin (CRESTOR) 10 MG tablet, TAKE 1 TABLET BY MOUTH EVERY DAY, Disp: 90 tablet, Rfl: 0  Allergies  Allergen Reactions   Mobic [Meloxicam] Other (See Comments)    bleeding    Review of Systems Constitutional: -fever, -chills, -sweats, -unexpected weight change, -decreased appetite, -fatigue Allergy: -sneezing, -itching, -congestion Dermatology: -changing moles, --rash, -lumps ENT: -runny nose, -ear pain, -sore throat, -hoarseness, -sinus pain, -teeth pain, - ringing in ears, -hearing loss, -nosebleeds Cardiology: -chest pain, -palpitations, -swelling, -difficulty  breathing when lying flat, -waking up short of breath Respiratory: -cough, -shortness of breath, -difficulty breathing with exercise or exertion, -wheezing, -coughing up blood Gastroenterology: -abdominal pain, -nausea, -vomiting, -diarrhea, -constipation, -blood in stool, -changes in bowel movement, -difficulty swallowing or eating Hematology: -bleeding, -bruising  Musculoskeletal: -joint aches, -muscle aches, -joint swelling, -back pain, -neck pain, -cramping, -changes in gait Ophthalmology: denies vision changes, eye redness, itching, discharge Urology: -burning with urination, -difficulty urinating, -blood in urine, -urinary frequency, -urgency, -incontinence Neurology: -headache, -weakness, -tingling, -numbness, -memory loss, -falls, -dizziness Psychology: -depressed mood, -agitation, -sleep problems      Objective:   Physical Exam  BP 120/80   Pulse 79   Ht '5\' 8"'$  (1.727 m)   Wt 245 lb (111.1 kg)   SpO2 97%   BMI 37.25 kg/m   Wt Readings from Last 3 Encounters:  10/03/20 245 lb (111.1 kg)  08/15/19 248 lb (112.5 kg)  06/01/18 253 lb (114.8 kg)     General appearence: alert, no distress, WD/WN, African American male Skin: several skin tags around neck circumferentially, no other worrisome lesions Neck: supple, no lymphadenopathy, no thyromegaly, no masses, no bruits Heart: RRR, normal S1, S2, no murmurs Lungs: CTA bilaterally, no wheezes, rhonchi, or rales Abdomen: +bs, soft, non tender, non distended, no masses, no hepatomegaly, no splenomegaly Back: non tender Musculoskeletal: limited exam, nontender, no swelling, no obvious deformity Extremities: no edema, no cyanosis, no clubbing Pulses: 2+ symmetric, upper and lower extremities, normal cap refill Neurological: alert, oriented x 3, CN2-12 intact, strength normal upper extremities and lower extremities, sensation normal throughout, DTRs 2+ throughout, no cerebellar signs, gait normal Psychiatric: normal affect, behavior  normal, pleasant  GU/rectal - declined, deferred to urology    Assessment and Plan :    Encounter Diagnoses  Name Primary?   Encounter for health maintenance examination in adult Yes   Osteoarthritis of hip, unspecified laterality, unspecified osteoarthritis type    Essential hypertension    Impaired fasting blood sugar    Mixed dyslipidemia    Status post left hip replacement    Screening for heart disease    Vaccine counseling    Screen for colon cancer    BMI 37.0-37.9, adult     This visit was a preventative care visit, also known as wellness visit or routine physical.   Topics typically include healthy lifestyle, diet, exercise, preventative care, vaccinations, sick and well care, proper use of emergency dept and after hours care, as well as other concerns.     Recommendations: Continue to return yearly for your annual wellness and preventative care visits.  This gives [place] a chance to discuss healthy lifestyle, exercise, vaccinations, review your chart record, and perform screenings where appropriate.  I recommend you see your eye doctor yearly for routine vision care.  I recommend you see your dentist yearly for routine dental care including hygiene visits twice yearly.  Vaccination recommendations were reviewed Immunization History  Administered Date(s) Administered   Influenza Split 12/04/2010   Influenza,inj,Quad PF,6+ Mos 12/09/2015, 01/22/2017   Influenza-Unspecified 01/22/2017   PFIZER Comirnaty(Gray Top)Covid-19 Tri-[place] Vaccine 07/04/2020   PFIZER(Purple Top)SARS-COV-2 Vaccination 01/03/2020   I recommend yearly flu shot in the fall  Shingles vaccine:  I recommend you have a shingles vaccine to help prevent shingles or herpes zoster outbreak.   Please call your insurer to inquire about coverage for the Shingrix vaccine given in 2 doses.   Some insurers cover this vaccine after age 1, some cover this after age 51.  If your insurer covers this, then call to  schedule appointment to have this vaccine here.  Check insurance also about tdap tetanus diptheria pertussis booster.  Please get [place] copy of your other covid vaccines.    Screening for cancer: Colon cancer screening: Consider Cologuard vs colonoscopy.  Please call your insurance company to check coverage for colon cancer screening.  Options may include Cologuard stool test or Colonoscopy.  You should also inquire about which facility the colonoscopy could be performed, and coverage for diagnostic vs screening colonoscopy as coverage may vary.  If you have significant family history of colon cancer or blood in the stool, then you should only do the colonoscopy, not the Cologuard test.  We discussed PSA, prostate exam, and prostate cancer screening risks/benefits.     Skin cancer screening: Check your skin regularly for new changes, growing lesions, or other lesions of concern Come in for evaluation if you have skin lesions of concern.  Lung cancer screening: If you have a greater than 20 pack year history of tobacco use, then you may qualify for lung cancer screening with a chest CT scan.   Please call your insurance company to inquire about coverage for this test.  We currently don't have screenings for other cancers besides breast, cervical, colon, and lung cancers.  If you have a strong family history of cancer or have other cancer screening concerns, please let me know.    Bone health: Get at least 150 minutes of aerobic exercise weekly Get weight bearing exercise at least once weekly Bone density test:  A bone density test is an imaging test that uses a type of X-ray to measure the amount of calcium and other minerals in your bones. The test may be used to diagnose or screen you for a condition that causes weak or thin bones (osteoporosis), predict your risk for a broken bone (fracture), or determine how well your osteoporosis treatment is working. The bone density test is  recommended for females 22 and older, or females or males [id number] if certain risk factors such as thyroid disease, long term use of steroids such as for asthma or rheumatological issues, vitamin D deficiency, estrogen deficiency, family history of osteoporosis, self or family history of fragility fracture in first degree relative.    Heart health: Get at least 150 minutes of aerobic exercise weekly Limit alcohol It is important to maintain a healthy blood pressure and healthy cholesterol numbers  Heart disease screening: Screening for heart disease includes screening for blood pressure, fasting lipids, glucose/diabetes screening, BMI height to weight ratio, reviewed of smoking status, physical activity, and diet.    Goals include blood pressure 120/80 or less, maintaining a healthy lipid/cholesterol profile, preventing diabetes or keeping diabetes numbers under good control, not smoking or using tobacco products, exercising most days per week or at least 150 minutes per week of exercise, and eating healthy  variety of fruits and vegetables, healthy oils, and avoiding unhealthy food choices like fried food, fast food, high sugar and high cholesterol foods.    Other tests may possibly include EKG test, CT coronary calcium score, echocardiogram, exercise treadmill stress test.     Medical care options: I recommend you continue to seek care here first for routine care.  We try really hard to have available appointments Monday through Friday daytime hours for sick visits, acute visits, and physicals.  Urgent care should be used for after hours and weekends for significant issues that cannot wait till the next day.  The emergency department should be used for significant potentially life-threatening emergencies.  The emergency department is expensive, can often have long wait times for less significant concerns, so try to utilize primary care, urgent care, or telemedicine when possible to avoid unnecessary  trips to the emergency department.  Virtual visits and telemedicine have been introduced since the pandemic started in 2020, and can be convenient ways to receive medical care.  We offer virtual appointments as well to assist you in a variety of options to seek medical care.    Separate significant issues discussed: Obesity - work on lifestyle changes and weight loss  Hypertension - continue current medication  Heart disease screening - consider CT calcium score  Mixed dyslipidemia - labs today, continue current medication  History of prostate cancer -  Managed by urology  Impaired glucose - labs today   Yuvin was seen today for fasting cpe.  Diagnoses and all orders for this visit:  Encounter for health maintenance examination in adult -     Comprehensive metabolic panel -     CBC with Differential/Platelet -     Lipid panel -     Hemoglobin A1c  Osteoarthritis of hip, unspecified laterality, unspecified osteoarthritis type  Essential hypertension -     Comprehensive metabolic panel  Impaired fasting blood sugar -     Hemoglobin A1c  Mixed dyslipidemia -     Lipid panel  Status post left hip replacement  Screening for heart disease  Vaccine counseling  Screen for colon cancer  BMI 37.0-37.9, adult   F/u yearly for physical, pending labs

## 2020-10-04 LAB — CBC WITH DIFFERENTIAL/PLATELET
Basophils Absolute: 0.1 10*3/uL (ref 0.0–0.2)
Basos: 1 %
EOS (ABSOLUTE): 0.3 10*3/uL (ref 0.0–0.4)
Eos: 4 %
Hematocrit: 47.6 % (ref 37.5–51.0)
Hemoglobin: 15.6 g/dL (ref 13.0–17.7)
Immature Grans (Abs): 0 10*3/uL (ref 0.0–0.1)
Immature Granulocytes: 0 %
Lymphocytes Absolute: 2.9 10*3/uL (ref 0.7–3.1)
Lymphs: 46 %
MCH: 30.1 pg (ref 26.6–33.0)
MCHC: 32.8 g/dL (ref 31.5–35.7)
MCV: 92 fL (ref 79–97)
Monocytes Absolute: 0.6 10*3/uL (ref 0.1–0.9)
Monocytes: 10 %
Neutrophils Absolute: 2.5 10*3/uL (ref 1.4–7.0)
Neutrophils: 39 %
Platelets: 199 10*3/uL (ref 150–450)
RBC: 5.18 x10E6/uL (ref 4.14–5.80)
RDW: 12.9 % (ref 11.6–15.4)
WBC: 6.4 10*3/uL (ref 3.4–10.8)

## 2020-10-04 LAB — COMPREHENSIVE METABOLIC PANEL
ALT: 23 IU/L (ref 0–44)
AST: 22 IU/L (ref 0–40)
Albumin/Globulin Ratio: 2 (ref 1.2–2.2)
Albumin: 4.5 g/dL (ref 3.8–4.8)
Alkaline Phosphatase: 50 IU/L (ref 44–121)
BUN/Creatinine Ratio: 10 (ref 10–24)
BUN: 11 mg/dL (ref 8–27)
Bilirubin Total: 0.9 mg/dL (ref 0.0–1.2)
CO2: 24 mmol/L (ref 20–29)
Calcium: 9.2 mg/dL (ref 8.6–10.2)
Chloride: 103 mmol/L (ref 96–106)
Creatinine, Ser: 1.12 mg/dL (ref 0.76–1.27)
Globulin, Total: 2.3 g/dL (ref 1.5–4.5)
Glucose: 108 mg/dL — ABNORMAL HIGH (ref 65–99)
Potassium: 4.7 mmol/L (ref 3.5–5.2)
Sodium: 140 mmol/L (ref 134–144)
Total Protein: 6.8 g/dL (ref 6.0–8.5)
eGFR: 73 mL/min/{1.73_m2} (ref >59)

## 2020-10-04 LAB — LIPID PANEL
Chol/HDL Ratio: 6.4 ratio — ABNORMAL HIGH (ref 0.0–5.0)
Cholesterol, Total: 166 mg/dL (ref 100–199)
HDL: 26 mg/dL — ABNORMAL LOW (ref >39)
LDL Chol Calc (NIH): 118 mg/dL — ABNORMAL HIGH (ref 0–99)
Triglycerides: 122 mg/dL (ref 0–149)
VLDL Cholesterol Cal: 22 mg/dL (ref 5–40)

## 2020-10-04 LAB — HEMOGLOBIN A1C
Est. average glucose Bld gHb Est-mCnc: 131 mg/dL
Hgb A1c MFr Bld: 6.2 % — ABNORMAL HIGH (ref 4.8–5.6)

## 2020-10-06 ENCOUNTER — Telehealth: Payer: Self-pay | Admitting: Internal Medicine

## 2020-10-06 NOTE — Telephone Encounter (Signed)
Pt called and states this his insurance will pay for Tdap and shingrix. He is coming in this Thursday for Tdap and then in a month for shingrix. He needs to get the TDAP correct?

## 2020-10-07 ENCOUNTER — Other Ambulatory Visit: Payer: Self-pay | Admitting: Medical

## 2020-10-07 MED ORDER — OMEGA-3-ACID ETHYL ESTERS 1 G PO CAPS: 2.0000 g | ORAL_CAPSULE | Freq: Two times a day (BID) | ORAL | 11 refills | Status: DC

## 2020-10-07 MED ORDER — ROSUVASTATIN CALCIUM 20 MG PO TABS: 20.0000 mg | ORAL_TABLET | Freq: Every day | ORAL | 3 refills | Status: DC

## 2020-10-07 NOTE — Telephone Encounter (Signed)
noted 

## 2020-10-09 ENCOUNTER — Other Ambulatory Visit (INDEPENDENT_AMBULATORY_CARE_PROVIDER_SITE_OTHER): Payer: BC Managed Care – PPO

## 2020-10-09 ENCOUNTER — Other Ambulatory Visit: Payer: Self-pay

## 2020-10-09 DIAGNOSIS — Z23 Encounter for immunization: Secondary | ICD-10-CM | POA: Diagnosis not present

## 2020-10-10 ENCOUNTER — Telehealth: Payer: Self-pay

## 2020-10-10 NOTE — Telephone Encounter (Signed)
P[facility] Ernestine Conrad [facility]) denied, states only covered if triglycerides are over 500.  Do you want to switch to something else?

## 2020-10-13 ENCOUNTER — Telehealth: Payer: Self-pay | Admitting: Medical

## 2020-10-13 NOTE — Telephone Encounter (Signed)
Pt called back again about this issue

## 2020-10-13 NOTE — Telephone Encounter (Signed)
Pt called and states that after his tdap shot on Friday he has been a little fatigue and his neck is sore and shoulder is sore where the shot was given, he has been taking tyneol, he wants to know if that is normal,  Pt also states that the omega 3 that was given is not covered by his his insurance   Pt can be reached at [phone]

## 2020-10-13 NOTE — Telephone Encounter (Signed)
Vascepa rep is Valda Lamb t# [phone]

## 2020-10-14 NOTE — Telephone Encounter (Signed)
I called and left message for the Vascepa if rep and left a message for her to call back  I would like him to be on a prescription fish oil such as Lovaza or Vascepa.  He has fatty liver disease, obesity, prediabetes, mixed dyslipidemia.  He is high risk for heart disease, pancreatitis, and I would like to use this medicine to help reduce overall risk

## 2020-10-21 MED ORDER — ICOSAPENT ETHYL 1 G PO CAPS: 2.0000 g | ORAL_CAPSULE | Freq: Two times a day (BID) | ORAL | 3 refills | Status: DC

## 2020-10-21 NOTE — Telephone Encounter (Signed)
Also sent in [place] and see if can get that approved, have form letter for appeal to try if denied.  Called pt and let him know what was going on

## 2020-10-30 ENCOUNTER — Other Ambulatory Visit (INDEPENDENT_AMBULATORY_CARE_PROVIDER_SITE_OTHER): Payer: BC Managed Care – PPO

## 2020-10-30 ENCOUNTER — Other Ambulatory Visit: Payer: Self-pay

## 2020-10-30 DIAGNOSIS — Z23 Encounter for immunization: Secondary | ICD-10-CM | POA: Diagnosis not present

## 2020-11-28 NOTE — Telephone Encounter (Signed)
Pt came by office and signed authorization for [place] to appeal.  Appeal was faxed in

## 2020-12-11 ENCOUNTER — Ambulatory Visit: Payer: BC Managed Care – PPO | Admitting: Medical

## 2020-12-12 ENCOUNTER — Telehealth: Payer: Self-pay

## 2020-12-12 NOTE — Telephone Encounter (Signed)
Received response back from state health plan that needs 3rd party authorization form with appeal but it was sent in with the appeal fax.  Called customer service at [phone] & they are closed

## 2020-12-12 NOTE — Telephone Encounter (Signed)
error 

## 2020-12-15 NOTE — Telephone Encounter (Signed)
Called [employer] spoke with customer service and after 22 minutes she still couldn't explain why appeal was invalid, asked for person who signed letter and was given their t# [phone], called and left message for Cleophas Dunker

## 2020-12-18 ENCOUNTER — Other Ambulatory Visit: Payer: Self-pay

## 2020-12-18 ENCOUNTER — Other Ambulatory Visit (INDEPENDENT_AMBULATORY_CARE_PROVIDER_SITE_OTHER): Payer: BC Managed Care – PPO

## 2020-12-18 ENCOUNTER — Other Ambulatory Visit: Payer: BC Managed Care – PPO

## 2020-12-18 DIAGNOSIS — Z23 Encounter for immunization: Secondary | ICD-10-CM

## 2020-12-19 ENCOUNTER — Telehealth: Payer: Self-pay

## 2020-12-19 NOTE — Telephone Encounter (Signed)
error 

## 2020-12-19 NOTE — Telephone Encounter (Signed)
Left another message for Cleophas Dunker t# [phone]

## 2020-12-22 NOTE — Telephone Encounter (Signed)
Recv'd appeal letter from [facility] t# [phone] and called & left message

## 2021-01-01 ENCOUNTER — Other Ambulatory Visit: Payer: Self-pay

## 2021-01-01 ENCOUNTER — Other Ambulatory Visit (INDEPENDENT_AMBULATORY_CARE_PROVIDER_SITE_OTHER): Payer: BC Managed Care – PPO

## 2021-01-01 DIAGNOSIS — Z23 Encounter for immunization: Secondary | ICD-10-CM

## 2021-01-09 NOTE — Telephone Encounter (Signed)
Appeal from [facility] for [place] denied stating member does not have established cardiovascular disease or diabetes and 2 or more risk factors. Audelia Acton do you want to switch to another medication or pt try OTC Fish oil ?

## 2021-01-12 NOTE — Telephone Encounter (Signed)
Pt informed of Shane's recommendations

## 2021-01-15 ENCOUNTER — Ambulatory Visit (INDEPENDENT_AMBULATORY_CARE_PROVIDER_SITE_OTHER): Payer: BC Managed Care – PPO

## 2021-01-15 ENCOUNTER — Other Ambulatory Visit: Payer: Self-pay

## 2021-01-15 DIAGNOSIS — Z23 Encounter for immunization: Secondary | ICD-10-CM | POA: Diagnosis not present

## 2021-03-01 DIAGNOSIS — I2510 Atherosclerotic heart disease of native coronary artery without angina pectoris: Secondary | ICD-10-CM

## 2021-03-01 DIAGNOSIS — I700 Atherosclerosis of aorta: Secondary | ICD-10-CM

## 2021-03-01 HISTORY — DX: Atherosclerotic heart disease of native coronary artery without angina pectoris: I25.10

## 2021-03-01 HISTORY — PX: COLONOSCOPY: SHX174

## 2021-03-01 HISTORY — DX: Atherosclerosis of aorta: I70.0

## 2021-05-06 ENCOUNTER — Encounter: Payer: Self-pay | Admitting: Gastroenterology

## 2021-07-16 ENCOUNTER — Encounter: Payer: Self-pay | Admitting: Gastroenterology

## 2021-08-06 ENCOUNTER — Ambulatory Visit (AMBULATORY_SURGERY_CENTER): Payer: Medicare HMO | Admitting: *Deleted

## 2021-08-06 VITALS — Ht 69.0 in | Wt 249.0 lb

## 2021-08-06 DIAGNOSIS — Z1211 Encounter for screening for malignant neoplasm of colon: Secondary | ICD-10-CM

## 2021-08-06 NOTE — Progress Notes (Signed)
No egg or soy allergy known to patient  No issues known to pt with past sedation with any surgeries or procedures Patient denies ever being told they had issues or difficulty with intubation  No FH of Malignant Hyperthermia Pt is not on diet pills Pt is not on  home 02  Pt is not on blood thinners  Pt denies issues with constipation  No A fib or A flutter   PV completed in person. Pt verified name, DOB, address and insurance during PV today.  Pt given instruction packet with copy of consent form to read and sign after procedure and instructions explained and questions answered. Pt encouraged to call with questions or issues.  If pt has My chart, procedure instructions sent via My Chart

## 2021-08-26 ENCOUNTER — Encounter: Payer: Self-pay | Admitting: Gastroenterology

## 2021-08-27 ENCOUNTER — Ambulatory Visit (AMBULATORY_SURGERY_CENTER): Payer: Medicare HMO | Admitting: Gastroenterology

## 2021-08-27 ENCOUNTER — Encounter: Payer: Self-pay | Admitting: Gastroenterology

## 2021-08-27 VITALS — BP 108/69 | HR 64 | Temp 98.4°F | Resp 10 | Ht 68.0 in | Wt 249.0 lb

## 2021-08-27 DIAGNOSIS — I10 Essential (primary) hypertension: Secondary | ICD-10-CM | POA: Diagnosis not present

## 2021-08-27 DIAGNOSIS — Z1211 Encounter for screening for malignant neoplasm of colon: Secondary | ICD-10-CM

## 2021-08-27 DIAGNOSIS — D120 Benign neoplasm of cecum: Secondary | ICD-10-CM

## 2021-08-27 DIAGNOSIS — E785 Hyperlipidemia, unspecified: Secondary | ICD-10-CM | POA: Diagnosis not present

## 2021-08-27 DIAGNOSIS — K5140 Inflammatory polyps of colon without complications: Secondary | ICD-10-CM

## 2021-08-27 MED ORDER — SODIUM CHLORIDE 0.9 % IV SOLN: 500.0000 mL | INTRAVENOUS | Status: DC

## 2021-08-27 NOTE — Progress Notes (Signed)
[facility] History and Physical   Primary Care Physician:  Carlena Hurl, PA-C   Reason for Procedure:   Colon cancer screening  Plan:    Screening colonoscopy     HPI: Mark Valentine is a 66 y.o. male undergoing average risk screening colonoscopy.  He has no family history of colon cancer and no chronic GI symptoms. He had a normal colonoscopy in Dec 2012.   Past Medical History:  Diagnosis Date   Anal fissure 1980   Hip pain, bilateral    pending surgery 09/2015, [facility]   History of testicular cancer    Hyperlipidemia    Hypertension 09/24/2015   no meds currently-elevates with MD visits.   Lipoprotein deficiencies    Low HDL (under 40)    Osteoarthritis of hip    Prostate cancer ([place]) 2017   robotic prostatectomy 06-04-15   Wears glasses     Past Surgical History:  Procedure Laterality Date   anal fistula repair  1980's   COLONOSCOPY  02/01/11   normal, repeat 2022; Dr. Sharlett Iles   HIP CLOSED REDUCTION Left 11/11/2015   Procedure: CLOSED REDUCTION HIP;  Surgeon: Paralee Cancel, MD;  Location: [facility];  Service: Orthopedics;  Laterality: Left;   ORCHIECTOMY  1997   prostate cancer- one testicle only   PROSTATE BIOPSY  2010   Dr. Amalia Hailey, Urology   PROSTATECTOMY  05/2015   robotic   TOTAL HIP ARTHROPLASTY Right 09/30/2015   Procedure: RIGHT TOTAL HIP ARTHROPLASTY ANTERIOR APPROACH;  Surgeon: Paralee Cancel, MD;  [facility];  Service: Orthopedics;  Laterality: Right;   TOTAL HIP ARTHROPLASTY Left 11/11/2015   Procedure: LEFT TOTAL HIP ARTHROPLASTY ANTERIOR APPROACH;  Surgeon: Paralee Cancel, MD;  [facility];  Service: Orthopedics;  Laterality: Left;    Prior to Admission medications   Medication Sig Start Date End Date Taking? Authorizing Provider  losartan (COZAAR) 50 MG tablet Take 1 tablet (50 mg total) by mouth daily. 10/03/20  Yes Tysinger, Camelia Eng, PA-C  aspirin EC 325 MG tablet Take by mouth.    [provider]  icosapent  Ethyl (VASCEPA) 1 g capsule Take 2 capsules (2 g total) by mouth 2 (two) times daily. Patient not taking: Reported on 08/06/2021 10/21/20   Tysinger, Camelia Eng, PA-C  rosuvastatin (CRESTOR) 10 MG tablet Take 1 tablet by mouth daily. Patient not taking: Reported on 08/06/2021 08/12/20   [provider]  rosuvastatin (CRESTOR) 20 MG tablet Take 1 tablet (20 mg total) by mouth daily. 10/07/20 10/07/21  Tysinger, Camelia Eng, PA-C    Current Outpatient Medications  Medication Sig Dispense Refill   losartan (COZAAR) 50 MG tablet Take 1 tablet (50 mg total) by mouth daily. 90 tablet 3   aspirin EC 325 MG tablet Take by mouth.     icosapent Ethyl (VASCEPA) 1 g capsule Take 2 capsules (2 g total) by mouth 2 (two) times daily. (Patient not taking: Reported on 08/06/2021) 120 capsule 3   rosuvastatin (CRESTOR) 10 MG tablet Take 1 tablet by mouth daily. (Patient not taking: Reported on 08/06/2021)     rosuvastatin (CRESTOR) 20 MG tablet Take 1 tablet (20 mg total) by mouth daily. 90 tablet 3   Current Facility-Administered Medications  Medication Dose Route Frequency Provider Last Rate Last Admin   0.9 %  sodium chloride infusion  500 mL Intravenous Continuous Daryel November, MD        Allergies as of 08/27/2021 - Review Complete 08/27/2021  Allergen Reaction  Noted   Mobic [meloxicam] Other (See Comments) 08/18/2015    Family History  Problem Relation Age of Onset   Arthritis Mother    Diabetes Mother    Stroke Mother    Hypertension Mother    Heart disease Father        aortic disease, possible dissection   Hypertension Sister    Mental illness Sister    Diabetes Sister    Hypertension Brother    Colon cancer Neg Hx    Colon polyps Neg Hx    Esophageal cancer Neg Hx    Stomach cancer Neg Hx    Rectal cancer Neg Hx     Social History   Socioeconomic History   Marital status: Married    Spouse name: Not on file   Number of children: 1   Years of education: Not on file   Highest  education level: Not on file  Occupational History   Not on file  Tobacco Use   Smoking status: Never   Smokeless tobacco: Never  Vaping Use   Vaping Use: Never used  Substance and Sexual Activity   Alcohol use: No   Drug use: No   Sexual activity: Yes  Other Topics Concern   Not on file  Social History Narrative   Married, limited exercise, former judge.  Worked for [employer], prior worked at [employer] in [place].  First wife passed away Jun 12, 1999.  Has 1 daugthter.  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  Mother passed 2015-06-12.   07/2019   Social Determinants of Health   Financial Resource Strain: Not on file  Food Insecurity: Not on file  Transportation Needs: Not on file  Physical Activity: Not on file  Stress: Not on file  Social Connections: Not on file  Intimate Partner Violence: Not on file    Review of Systems:  All other review of systems negative except as mentioned in the HPI.  Physical Exam: Vital signs BP (!) 150/79   Pulse 77   Temp 98.4 F (36.9 C)   Ht '5\' 8"'$  (1.727 m)   Wt 249 lb (112.9 kg)   SpO2 97%   BMI 37.86 kg/m   General:   Alert,  Well-developed, well-nourished, pleasant and cooperative in NAD Airway:  Mallampati 3 Lungs:  Clear throughout to auscultation.   Heart:  Regular rate and rhythm; no murmurs, clicks, rubs,  or gallops. Abdomen:  Soft, nontender and nondistended. Normal bowel sounds.   Neuro/Psych:  Normal mood and affect. A and O x 3   Troy Kanouse E. Candis Schatz, MD [facility]

## 2021-08-27 NOTE — Op Note (Signed)
[name] Patient Name: Mark Valentine Procedure Date: 08/27/2021 9:12 AM MRN: [id number] Endoscopist: Nicki Reaper E. Candis Schatz , MD Age: 66 Referring MD:  Date of Birth: Feb 04, 1956 Gender: Male Account #: [id number] Procedure:                Colonoscopy Indications:              Screening for colorectal malignant neoplasm (last                            colonoscopy was more than 10 years ago) Medicines:                Monitored Anesthesia Care Procedure:                Pre-Anesthesia Assessment:                           - Prior to the procedure, a History and Physical                            was performed, and patient medications and                            allergies were reviewed. The patient's tolerance of                            previous anesthesia was also reviewed. The risks                            and benefits of the procedure and the sedation                            options and risks were discussed with the patient.                            All questions were answered, and informed consent                            was obtained. Prior Anticoagulants: The patient has                            taken no previous anticoagulant or antiplatelet                            agents. ASA Grade Assessment: II - A patient with                            mild systemic disease. After reviewing the risks                            and benefits, the patient was deemed in                            satisfactory condition to undergo the procedure.  After obtaining informed consent, the colonoscope                            was passed under direct vision. Throughout the                            procedure, the patient's blood pressure, pulse, and                            oxygen saturations were monitored continuously. The                            CF [id number] #[id number] was introduced through the anus                            and advanced to  the the cecum, identified by                            appendiceal orifice and ileocecal valve. The                            colonoscopy was somewhat difficult due to                            significant looping. Successful completion of the                            procedure was aided by using manual pressure and                            withdrawing and reinserting the scope. The patient                            tolerated the procedure well. The quality of the                            bowel preparation was adequate. The ileocecal                            valve, appendiceal orifice, and rectum were                            photographed. The bowel preparation used was                            Miralax via split dose instruction. Scope In: 9:18:38 AM Scope Out: 9:39:36 AM Scope Withdrawal Time: 0 hours 12 minutes 13 seconds  Total Procedure Duration: 0 hours 20 minutes 58 seconds  Findings:                 The perianal and digital rectal examinations were                            normal. Pertinent negatives include normal  sphincter tone and no palpable rectal lesions.                           A 4 mm polyp was found in the sigmoid colon. The                            polyp was sessile. The polyp was removed with a                            cold snare. Resection and retrieval were complete.                            Estimated blood loss was minimal.                           The exam was otherwise normal throughout the                            examined colon.                           The retroflexed view of the distal rectum and anal                            verge was normal and showed no anal or rectal                            abnormalities. Complications:            No immediate complications. Estimated Blood Loss:     Estimated blood loss was minimal. Impression:               - One 4 mm polyp in the sigmoid colon, removed with                             a cold snare. Resected and retrieved.                           - The distal rectum and anal verge are normal on                            retroflexion view. Recommendation:           - Patient has a contact number available for                            emergencies. The signs and symptoms of potential                            delayed complications were discussed with the                            patient. Return to normal activities tomorrow.  Written discharge instructions were provided to the                            patient.                           - Resume previous diet.                           - Continue present medications.                           - Await pathology results.                           - Repeat colonoscopy (date not yet determined) for                            surveillance based on pathology results. Gjon Letarte E. Candis Schatz, MD 08/27/2021 9:44:44 AM This report has been signed electronically.

## 2021-08-27 NOTE — Patient Instructions (Signed)
Information on polyps given to you today.  Await pathology results.  Resume previous diet and medications.   YOU HAD AN ENDOSCOPIC PROCEDURE TODAY AT [facility]:   Refer to the procedure report that was given to you for any specific questions about what was found during the examination.  If the procedure report does not answer your questions, please call your gastroenterologist to clarify.  If you requested that your care partner not be given the details of your procedure findings, then the procedure report has been included in a sealed envelope for you to review at your convenience later.  YOU SHOULD EXPECT: Some feelings of bloating in the abdomen. Passage of more gas than usual.  Walking can help get rid of the air that was put into your GI tract during the procedure and reduce the bloating. If you had a lower endoscopy (such as a colonoscopy or flexible sigmoidoscopy) you may notice spotting of blood in your stool or on the toilet paper. If you underwent a bowel prep for your procedure, you may not have a normal bowel movement for a few days.  Please Note:  You might notice some irritation and congestion in your nose or some drainage.  This is from the oxygen used during your procedure.  There is no need for concern and it should clear up in a day or so.  SYMPTOMS TO REPORT IMMEDIATELY:  Following lower endoscopy (colonoscopy or flexible sigmoidoscopy):  Excessive amounts of blood in the stool  Significant tenderness or worsening of abdominal pains  Swelling of the abdomen that is new, acute  Fever of 100F or higher  For urgent or emergent issues, a gastroenterologist can be reached at any hour by calling [phone]. Do not use MyChart messaging for urgent concerns.    DIET:  We do recommend a small meal at first, but then you may proceed to your regular diet.  Drink plenty of fluids but you should avoid alcoholic beverages for 24 hours.  ACTIVITY:  You should  plan to take it easy for the rest of today and you should NOT DRIVE or use heavy machinery until tomorrow (because of the sedation medicines used during the test).    FOLLOW UP: Our staff will call the number listed on your records the next business day following your procedure.  We will call around 7:15- 8:00 am to check on you and address any questions or concerns that you may have regarding the information given to you following your procedure. If we do not reach you, we will leave a message.  If you develop any symptoms (ie: fever, flu-like symptoms, shortness of breath, cough etc.) before then, please call [phone].  If you test positive for Covid 19 in the 2 weeks post procedure, please call and report this information to [place].    If any biopsies were taken you will be contacted by phone or by letter within the next 1-3 weeks.  Please call us at [phone] if you have not heard about the biopsies in 3 weeks.    SIGNATURES/CONFIDENTIALITY: You and/or your care partner have signed paperwork which will be entered into your electronic medical record.  These signatures attest to the fact that that the information above on your After Visit Summary has been reviewed and is understood.  Full responsibility of the confidentiality of this discharge information lies with you and/or your care-partner.

## 2021-08-27 NOTE — Progress Notes (Signed)
Called to room to assist during endoscopic procedure.  Patient ID and intended procedure confirmed with present staff. Received instructions for my participation in the procedure from the performing physician.  

## 2021-08-27 NOTE — Progress Notes (Signed)
To pacu, VSS. Report to [email or web address] 

## 2021-08-28 ENCOUNTER — Telehealth: Payer: Self-pay | Admitting: *Deleted

## 2021-08-28 NOTE — Telephone Encounter (Signed)
  Follow up Call-     08/27/2021    8:11 AM  Call back number  Post procedure Call Back phone  # [phone]  Permission to leave phone message Yes     Patient questions:  Do you have a fever, pain , or abdominal swelling? Yes.   Pain Score  0 *  Have you tolerated food without any problems? Yes.    Have you been able to return to your normal activities? Yes.    Do you have any questions about your discharge instructions: Diet   No. Medications  No. Follow up visit  No.  Do you have questions or concerns about your Care? No.  Actions: * If pain score is 4 or above: No action needed, pain <4.

## 2021-09-03 ENCOUNTER — Encounter: Payer: Self-pay | Admitting: Gastroenterology

## 2021-10-02 DIAGNOSIS — H5200 Hypermetropia, unspecified eye: Secondary | ICD-10-CM | POA: Diagnosis not present

## 2021-10-02 DIAGNOSIS — H524 Presbyopia: Secondary | ICD-10-CM | POA: Diagnosis not present

## 2021-10-08 ENCOUNTER — Encounter: Payer: BC Managed Care – PPO | Admitting: Medical

## 2021-10-12 ENCOUNTER — Other Ambulatory Visit: Payer: Self-pay | Admitting: Medical

## 2021-10-12 NOTE — Telephone Encounter (Signed)
Pt has upcoming appoitnment soon

## 2021-10-29 ENCOUNTER — Encounter: Payer: Self-pay | Admitting: Medical

## 2021-10-29 ENCOUNTER — Ambulatory Visit (INDEPENDENT_AMBULATORY_CARE_PROVIDER_SITE_OTHER): Payer: Medicare HMO | Admitting: Medical

## 2021-10-29 ENCOUNTER — Encounter: Payer: BC Managed Care – PPO | Admitting: Medical

## 2021-10-29 VITALS — BP 134/88 | HR 87 | Temp 96.8°F | Ht 68.0 in | Wt 247.8 lb

## 2021-10-29 DIAGNOSIS — Z6837 Body mass index (BMI) 37.0-37.9, adult: Secondary | ICD-10-CM | POA: Diagnosis not present

## 2021-10-29 DIAGNOSIS — E782 Mixed hyperlipidemia: Secondary | ICD-10-CM

## 2021-10-29 DIAGNOSIS — I10 Essential (primary) hypertension: Secondary | ICD-10-CM | POA: Diagnosis not present

## 2021-10-29 DIAGNOSIS — Z96642 Presence of left artificial hip joint: Secondary | ICD-10-CM

## 2021-10-29 DIAGNOSIS — R7301 Impaired fasting glucose: Secondary | ICD-10-CM

## 2021-10-29 DIAGNOSIS — L989 Disorder of the skin and subcutaneous tissue, unspecified: Secondary | ICD-10-CM | POA: Diagnosis not present

## 2021-10-29 DIAGNOSIS — C61 Malignant neoplasm of prostate: Secondary | ICD-10-CM | POA: Diagnosis not present

## 2021-10-29 DIAGNOSIS — Z8547 Personal history of malignant neoplasm of testis: Secondary | ICD-10-CM | POA: Diagnosis not present

## 2021-10-29 DIAGNOSIS — Z0000 Encounter for general adult medical examination without abnormal findings: Secondary | ICD-10-CM

## 2021-10-29 DIAGNOSIS — Z7185 Encounter for immunization safety counseling: Secondary | ICD-10-CM | POA: Diagnosis not present

## 2021-10-29 DIAGNOSIS — L570 Actinic keratosis: Secondary | ICD-10-CM | POA: Diagnosis not present

## 2021-10-29 DIAGNOSIS — L918 Other hypertrophic disorders of the skin: Secondary | ICD-10-CM

## 2021-10-29 NOTE — Progress Notes (Signed)
Subjective:    Mark Valentine is a 66 y.o. male who presents for Preventative Services visit and chronic medical problems/med check visit.    Primary Care Provider Ezequiel Macauley, Camelia Eng, PA-C here for primary care  Medical care team includes: Dr. Philipp Ovens, dentist Eye doctor Dr. Lanell Persons Urology, Dr. Tresa Endo, [facility] Dr. Paralee Cancel, orthopedist   Medical Services you may have received from other than [facility] providers in the past year (date may be approximate) Urology, goes yearly for history of prostate cancer f/u  Exercise Current exercise habits: some.  Needs to do more  Nutrition/Diet Current diet: trying to eat healthy   Depression Screen    10/29/2021    9:14 AM  Depression screen PHQ 2/9  Decreased Interest 0  Down, Depressed, Hopeless 0  PHQ - 2 Score 0    Activities of Daily Living Screen/Functional Status Survey Is the patient deaf or have difficulty hearing?: No Does the patient have difficulty seeing, even when wearing glasses/contacts?: No Does the patient have difficulty concentrating, remembering, or making decisions?: No Does the patient have difficulty walking or climbing stairs?: No Does the patient have difficulty dressing or bathing?: No Does the patient have difficulty doing errands alone such as visiting a doctor's office or shopping?: No  Can patient draw a clock face showing 3:15 oclock, yes  Fall Risk Screen    10/29/2021    9:13 AM 10/03/2020   10:18 AM 08/15/2019    1:54 PM 02/23/2017    8:16 AM  Fuig in the past year? 0 0 1 No  Number falls in past yr: 0 0 0   Injury with Fall? 0 0 1   Risk for fall due to : No Fall Risks No Fall Risks    Follow up Falls evaluation completed Falls evaluation completed      Gait Assessment: Normal gait observed yes  Advanced directives Does patient have a [place]? No Does patient have a Living Will? No  Past Medical History:  Diagnosis Date   Anal fissure  1980   Hip pain, bilateral    pending surgery 09/2015, [facility]   History of testicular cancer    Hyperlipidemia    Hypertension 09/24/2015   no meds currently-elevates with MD visits.   Lipoprotein deficiencies    Low HDL (under 40)    Osteoarthritis of hip    Prostate cancer ([place]) 2017   robotic prostatectomy 06-04-15   Wears glasses     Past Surgical History:  Procedure Laterality Date   anal fistula repair  1980's   COLONOSCOPY  02/01/2011   normal, repeat 2022; Dr. Sharlett Iles   COLONOSCOPY  2023   HIP CLOSED REDUCTION Left 11/11/2015   Procedure: CLOSED REDUCTION HIP;  Surgeon: Paralee Cancel, MD;  Location: [facility];  Service: Orthopedics;  Laterality: Left;   ORCHIECTOMY  1997   prostate cancer- one testicle only   PROSTATE BIOPSY  2010   Dr. Amalia Hailey, Urology   PROSTATECTOMY  05/2015   robotic   TOTAL HIP ARTHROPLASTY Right 09/30/2015   Procedure: RIGHT TOTAL HIP ARTHROPLASTY ANTERIOR APPROACH;  Surgeon: Paralee Cancel, MD;  Location: [facility];  Service: Orthopedics;  Laterality: Right;   TOTAL HIP ARTHROPLASTY Left 11/11/2015   Procedure: LEFT TOTAL HIP ARTHROPLASTY ANTERIOR APPROACH;  Surgeon: Paralee Cancel, MD;  Location: [facility];  Service: Orthopedics;  Laterality: Left;    Social History   Socioeconomic History   Marital status: Married  Spouse name: Not on file   Number of children: 1   Years of education: Not on file   Highest education level: Not on file  Occupational History   Not on file  Tobacco Use   Smoking status: Never   Smokeless tobacco: Never  Vaping Use   Vaping Use: Never used  Substance and Sexual Activity   Alcohol use: No   Drug use: No   Sexual activity: Yes  Other Topics Concern   Not on file  Social History Narrative   Married, limited exercise, former judge.  Worked for [employer], prior worked at [employer] in [place].  First wife passed away June 10, 1999.  Has 1 daugthter.  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  Mother passed 06-10-2015.   09/2021    Social Determinants of Health   Financial Resource Strain: Not on file  Food Insecurity: Not on file  Transportation Needs: Not on file  Physical Activity: Not on file  Stress: Not on file  Social Connections: Not on file  Intimate Partner Violence: Not on file    Family History  Problem Relation Age of Onset   Arthritis Mother    Diabetes Mother    Stroke Mother    Hypertension Mother    Heart disease Father        aortic disease, possible dissection   Hypertension Sister    Mental illness Sister    Diabetes Sister    Hypertension Brother    Colon cancer Neg Hx    Colon polyps Neg Hx    Esophageal cancer Neg Hx    Stomach cancer Neg Hx    Rectal cancer Neg Hx      Current Outpatient Medications:    aspirin EC 325 MG tablet, Take by mouth., Disp: , Rfl:    losartan (COZAAR) 50 MG tablet, TAKE 1 TABLET BY MOUTH EVERY DAY, Disp: 90 tablet, Rfl: 0   rosuvastatin (CRESTOR) 20 MG tablet, Take 1 tablet (20 mg total) by mouth daily., Disp: 90 tablet, Rfl: 3  Allergies  Allergen Reactions   Mobic [Meloxicam] Other (See Comments)    bleeding    History reviewed: allergies, current medications, past family history, past medical history, past social history, past surgical history and problem list  Chronic issues discussed: Hypertension-compliant with medication  Hyperlipidemia-compliant with medication without complaint  History of prostate cancer.   Sees Urology, Dr. Rosana Hoes.  S/p robotic prostatectomy in April 2017.  History of testicular cancer and prior radiation therapy  Acute issues discussed: He has some skin lesions he wants removed close to his eye and some other wounds on his body, skin tags    Objective:      Biometrics BP 134/88   Pulse 87   Temp (!) 96.8 F (36 C)   Ht '5\' 8"'$  (1.727 m)   Wt 247 lb 12.8 oz (112.4 kg)   SpO2 96%   BMI 37.68 kg/m   Wt Readings from Last 3 Encounters:  10/29/21 247 lb 12.8 oz (112.4 kg)  08/27/21 249 lb (112.9  kg)  08/06/21 249 lb (112.9 kg)    Gen: wd, wn ,nad Skin: Dry skin with some lichenification in the lower anterior shins, several larger skin tags 1 on left anterior shoulder, 1 on right upper back, several across the upper chest anteriorly, several smaller pedunculated skin tags of the left lateral orbit, right temple region and a cutaneous horn on the right near that is small but thickened, no other worrisome lesions HEENT: normocephalic, sclerae  anicteric, TMs pearly, nares patent, no discharge or erythema, pharynx normal Oral cavity: MMM, no lesions Neck: supple, no lymphadenopathy, no thyromegaly, no masses, no bruits Heart: RRR, normal S1, S2, no murmurs Lungs: CTA bilaterally, no wheezes, rhonchi, or rales Abdomen: +bs, soft, non tender, non distended, no masses, no hepatomegaly, no splenomegaly Musculoskeletal: nontender, no swelling, no obvious deformity Extremities: no edema, no cyanosis, no clubbing Pulses: 2+ symmetric, upper and lower extremities, normal cap refill Neurological: alert, oriented x 3, CN2-12 intact, strength normal upper extremities and lower extremities, sensation normal throughout, DTRs 2+ throughout, no cerebellar signs, gait normal Psychiatric: normal affect, behavior normal, pleasant  GU/rectal -deferred to urology    Assessment:   Encounter Diagnoses  Name Primary?   Encounter for health maintenance examination in adult Yes   BMI 37.0-37.9, adult    Essential hypertension    Impaired fasting blood sugar    Mixed dyslipidemia    Status post left hip replacement    Vaccine counseling    Skin lesion    Actinic keratosis    Skin tag      Plan:    This visit was a preventative care visit, also known as wellness visit or routine physical.   Topics typically include healthy lifestyle, diet, exercise, preventative care, vaccinations, sick and well care, proper use of emergency dept and after hours care, as well as other concerns.      Recommendations: Continue to return yearly for your annual wellness and preventative care visits.  This gives [place] a chance to discuss healthy lifestyle, exercise, vaccinations, review your chart record, and perform screenings where appropriate.  I recommend you see your eye doctor yearly for routine vision care.  I recommend you see your dentist yearly for routine dental care including hygiene visits twice yearly.   Vaccination recommendations were reviewed Immunization History  Administered Date(s) Administered   Influenza Split 12/04/2010   Influenza,inj,Quad PF,6+ Mos 12/09/2015, 01/22/2017, 12/18/2020   Influenza-Unspecified 12/09/2015, 01/22/2017   PFIZER Comirnaty(Gray Top)Covid-19 Tri-Sucrose Vaccine 07/04/2020   PFIZER(Purple Top)SARS-COV-2 Vaccination 05/12/2019, 06/02/2019, 01/03/2020   Pfizer Covid-19 Vaccine Bivalent Booster 8yr & up 01/15/2021   Tdap 10/09/2020   Zoster Recombinat (Shingrix) 10/30/2020, 01/01/2021    I recommend a pneumococcal and flu vaccine.  I recommend RSV vaccine.   Screening for cancer: Colon cancer screening: I reviewed your colonoscopy on file that is up to date from 07/2021  We discussed PSA, prostate exam, and prostate cancer screening risks/benefits.     Skin cancer screening: Check your skin regularly for new changes, growing lesions, or other lesions of concern Come in for evaluation if you have skin lesions of concern.  Lung cancer screening: If you have a greater than 20 pack year history of tobacco use, then you may qualify for lung cancer screening with a chest CT scan.   Please call your insurance company to inquire about coverage for this test.  We currently don't have screenings for other cancers besides breast, cervical, colon, and lung cancers.  If you have a strong family history of cancer or have other cancer screening concerns, please let me know.    Bone health: Get at least 150 minutes of aerobic exercise  weekly Get weight bearing exercise at least once weekly Bone density test:  A bone density test is an imaging test that uses a type of X-ray to measure the amount of calcium and other minerals in your bones. The test may be used to diagnose or screen you for a  condition that causes weak or thin bones (osteoporosis), predict your risk for a broken bone (fracture), or determine how well your osteoporosis treatment is working. The bone density test is recommended for females 59 and older, or females or males <62 if certain risk factors such as thyroid disease, long term use of steroids such as for asthma or rheumatological issues, vitamin D deficiency, estrogen deficiency, family history of osteoporosis, self or family history of fragility fracture in first degree relative.    Heart health: Get at least 150 minutes of aerobic exercise weekly Limit alcohol It is important to maintain a healthy blood pressure and healthy cholesterol numbers  Heart disease screening: Screening for heart disease includes screening for blood pressure, fasting lipids, glucose/diabetes screening, BMI height to weight ratio, reviewed of smoking status, physical activity, and diet.    Goals include blood pressure 120/80 or less, maintaining a healthy lipid/cholesterol profile, preventing diabetes or keeping diabetes numbers under good control, not smoking or using tobacco products, exercising most days per week or at least 150 minutes per week of exercise, and eating healthy variety of fruits and vegetables, healthy oils, and avoiding unhealthy food choices like fried food, fast food, high sugar and high cholesterol foods.    Other tests may possibly include EKG test, CT coronary calcium score, echocardiogram, exercise treadmill stress test.   I recommend CT coronary test   Medical care options: I recommend you continue to seek care here first for routine care.  We try really hard to have available appointments Monday  through Friday daytime hours for sick visits, acute visits, and physicals.  Urgent care should be used for after hours and weekends for significant issues that cannot wait till the next day.  The emergency department should be used for significant potentially life-threatening emergencies.  The emergency department is expensive, can often have long wait times for less significant concerns, so try to utilize primary care, urgent care, or telemedicine when possible to avoid unnecessary trips to the emergency department.  Virtual visits and telemedicine have been introduced since the pandemic started in 2020, and can be convenient ways to receive medical care.  We offer virtual appointments as well to assist you in a variety of options to seek medical care.    Separate significant issues discussed: Blood pressures -continue current medication  High cholesterol-continue current medication, routine labs today  Impaired glucose, prediabetes-updated labs today  Skin tags, skin lesion-I placed a referral to dermatology but it could take a while to get in there.  You can alternatively call insurance to see with your insurance coverage is for skin tag changing moles that might need biopsy or excision or cryotherapy.  You can schedule a time to come back in for skin lesions specifically  Prediabetes means you have a higher than normal blood sugar level. It's not high enough to be considered type 2 diabetes yet, but without making some lifestyle changes you are more likely to develop type 2 diabetes.  If you have prediabetes, the long-term damage of diabetes, especially to your heart, blood vessels and kidneys may already be starting. You may not be able to change certain risk factors such as age, race, or family history, but you CAN make changes to your lifestyle, your eating habits, and your activity.  Although diabetes can develop at any age, the risk of prediabetes increases after age 7.  Your risk of  prediabetes increases if you have a parent or sibling with type 2 diabetes.   Although  it's unclear why, certain people including Black, Hispanic, American [place] American people, are more likely to develop prediabetes.  Ways to prevent or slow progression to diabetes: Eat healthy foods - Eating red meat and processed meat, and drinking sugar-sweetened beverages, is associated with a higher risk of prediabetes. A diet high in fruits, vegetables, nuts, whole grains and olive oil is associated with a lower risk of prediabetes. Get at least 150 minutes of moderate aerobic physical activity a week, or about 30 minutes on most days of the week.  The less active you are, the greater your risk of prediabetes. Physical activity helps you control your weight, uses up sugar for energy and makes the body use insulin more effectively. Lose excess weight - Being overweight is a primary risk factor for prediabetes. The more fatty tissue you have, especially inside and between the muscle and skin around your abdomen, the more resistant your cells become to insulin. Waist size. A large waist size can indicate insulin resistance. The risk of insulin resistance goes up for men with waists larger than 40 inches and for women with waists larger than 35 inches. Control your blood pressure and cholesterol.  If your blood pressure is not 130/80 or less, discuss with your provider to help get this under control.  It is ideal to have an HDL good cholesterol number >50 and have a LDL bad cholesterol number <100.   Don't smoke One simple strategy to help you make good food choices and eat appropriate portions sizes is to divide up your plate. These three divisions on your plate promote healthy eating:  One-half: fruit and nonstarchy vegetables One-quarter: whole grains One-quarter: protein-rich foods, such as legumes, fish or lean meats    Kao was seen today for welcome medicare.  Diagnoses and all orders for  this visit:  Encounter for health maintenance examination in adult -     Comprehensive metabolic panel -     CBC -     Lipid panel -     Hemoglobin A1c -     Ambulatory referral to Dermatology  BMI 37.0-37.9, adult  Essential hypertension  Impaired fasting blood sugar -     Hemoglobin A1c  Mixed dyslipidemia -     Lipid panel  Status post left hip replacement  Vaccine counseling  Skin lesion -     Ambulatory referral to Dermatology  Actinic keratosis  Skin tag   Medicare Attestation A preventative services visit was completed today.  During the course of the visit the patient was educated and counseled about appropriate screening and preventive services.  A health risk assessment was established with the patient that included a review of current medications, allergies, social history, family history, medical and preventative health history, biometrics, and preventative screenings to identify potential safety concerns or impairments.  A personalized plan was printed today for the patient's records and use.   Personalized health advice and education was given today to reduce health risks and promote self management and wellness.  Information regarding end of life planning was discussed today.  Dorothea Ogle, PA-C   10/29/2021

## 2021-10-29 NOTE — Patient Instructions (Addendum)
DENTIST RECOMMENDATION Dr. Jonna Coup, dentist [address] [place], [place] [address] [phone] [email or web address]     This visit was a preventative care visit, also known as wellness visit or routine physical.   Topics typically include healthy lifestyle, diet, exercise, preventative care, vaccinations, sick and well care, proper use of emergency dept and after hours care, as well as other concerns.     Recommendations: Continue to return yearly for your annual wellness and preventative care visits.  This gives [place] a chance to discuss healthy lifestyle, exercise, vaccinations, review your chart record, and perform screenings where appropriate.  I recommend you see your eye doctor yearly for routine vision care.  I recommend you see your dentist yearly for routine dental care including hygiene visits twice yearly.   Vaccination recommendations were reviewed Immunization History  Administered Date(s) Administered   Influenza Split 12/04/2010   Influenza,inj,Quad PF,6+ Mos 12/09/2015, 01/22/2017, 12/18/2020   Influenza-Unspecified 12/09/2015, 01/22/2017   PFIZER Comirnaty(Gray Top)Covid-19 Tri-Sucrose Vaccine 07/04/2020   PFIZER(Purple Top)SARS-COV-2 Vaccination 05/12/2019, 06/02/2019, 01/03/2020   Pfizer Covid-19 Vaccine Bivalent Booster 29yr & up 01/15/2021   Tdap 10/09/2020   Zoster Recombinat (Shingrix) 10/30/2020, 01/01/2021    I recommend a pneumococcal and flu vaccine.  I recommend RSV vaccine.   Screening for cancer: Colon cancer screening: I reviewed your colonoscopy on file that is up to date from 07/2021  We discussed PSA, prostate exam, and prostate cancer screening risks/benefits.     Skin cancer screening: Check your skin regularly for new changes, growing lesions, or other lesions of concern Come in for evaluation if you have skin lesions of concern.  Lung cancer screening: If you have a greater than 20 pack year history of tobacco use, then you may  qualify for lung cancer screening with a chest CT scan.   Please call your insurance company to inquire about coverage for this test.  We currently don't have screenings for other cancers besides breast, cervical, colon, and lung cancers.  If you have a strong family history of cancer or have other cancer screening concerns, please let me know.    Bone health: Get at least 150 minutes of aerobic exercise weekly Get weight bearing exercise at least once weekly Bone density test:  A bone density test is an imaging test that uses a type of X-ray to measure the amount of calcium and other minerals in your bones. The test may be used to diagnose or screen you for a condition that causes weak or thin bones (osteoporosis), predict your risk for a broken bone (fracture), or determine how well your osteoporosis treatment is working. The bone density test is recommended for females 624and older, or females or males <<81if certain risk factors such as thyroid disease, long term use of steroids such as for asthma or rheumatological issues, vitamin D deficiency, estrogen deficiency, family history of osteoporosis, self or family history of fragility fracture in first degree relative.    Heart health: Get at least 150 minutes of aerobic exercise weekly Limit alcohol It is important to maintain a healthy blood pressure and healthy cholesterol numbers  Heart disease screening: Screening for heart disease includes screening for blood pressure, fasting lipids, glucose/diabetes screening, BMI height to weight ratio, reviewed of smoking status, physical activity, and diet.    Goals include blood pressure 120/80 or less, maintaining a healthy lipid/cholesterol profile, preventing diabetes or keeping diabetes numbers under good control, not smoking or using tobacco products, exercising most days per week or  at least 150 minutes per week of exercise, and eating healthy variety of fruits and vegetables, healthy oils,  and avoiding unhealthy food choices like fried food, fast food, high sugar and high cholesterol foods.    Other tests may possibly include EKG test, CT coronary calcium score, echocardiogram, exercise treadmill stress test.   I recommend CT coronary test   Medical care options: I recommend you continue to seek care here first for routine care.  We try really hard to have available appointments Monday through Friday daytime hours for sick visits, acute visits, and physicals.  Urgent care should be used for after hours and weekends for significant issues that cannot wait till the next day.  The emergency department should be used for significant potentially life-threatening emergencies.  The emergency department is expensive, can often have long wait times for less significant concerns, so try to utilize primary care, urgent care, or telemedicine when possible to avoid unnecessary trips to the emergency department.  Virtual visits and telemedicine have been introduced since the pandemic started in 2020, and can be convenient ways to receive medical care.  We offer virtual appointments as well to assist you in a variety of options to seek medical care.    Separate significant issues discussed: Blood pressures -continue current medication  High cholesterol-continue current medication, routine labs today  Impaired glucose, prediabetes-updated labs today  Skin tags, skin lesion-I placed a referral to dermatology but it could take a while to get in there.  You can alternatively call insurance to see with your insurance coverage is for skin tag changing moles that might need biopsy or excision or cryotherapy.  You can schedule a time to come back in for skin lesions specifically  Prediabetes means you have a higher than normal blood sugar level. It's not high enough to be considered type 2 diabetes yet, but without making some lifestyle changes you are more likely to develop type 2 diabetes.  If you  have prediabetes, the long-term damage of diabetes, especially to your heart, blood vessels and kidneys may already be starting. You may not be able to change certain risk factors such as age, race, or family history, but you CAN make changes to your lifestyle, your eating habits, and your activity.  Although diabetes can develop at any age, the risk of prediabetes increases after age 74.  Your risk of prediabetes increases if you have a parent or sibling with type 2 diabetes.   Although it's unclear why, certain people including Black, Hispanic, American [place] American people, are more likely to develop prediabetes.  Ways to prevent or slow progression to diabetes: Eat healthy foods - Eating red meat and processed meat, and drinking sugar-sweetened beverages, is associated with a higher risk of prediabetes. A diet high in fruits, vegetables, nuts, whole grains and olive oil is associated with a lower risk of prediabetes. Get at least 150 minutes of moderate aerobic physical activity a week, or about 30 minutes on most days of the week.  The less active you are, the greater your risk of prediabetes. Physical activity helps you control your weight, uses up sugar for energy and makes the body use insulin more effectively. Lose excess weight - Being overweight is a primary risk factor for prediabetes. The more fatty tissue you have, especially inside and between the muscle and skin around your abdomen, the more resistant your cells become to insulin. Waist size. A large waist size can indicate insulin resistance. The risk of  insulin resistance goes up for men with waists larger than 40 inches and for women with waists larger than 35 inches. Control your blood pressure and cholesterol.  If your blood pressure is not 130/80 or less, discuss with your provider to help get this under control.  It is ideal to have an HDL good cholesterol number >50 and have a LDL bad cholesterol number <100.   Don't  smoke One simple strategy to help you make good food choices and eat appropriate portions sizes is to divide up your plate. These three divisions on your plate promote healthy eating:  One-half: fruit and nonstarchy vegetables One-quarter: whole grains One-quarter: protein-rich foods, such as legumes, fish or lean meats

## 2021-10-30 ENCOUNTER — Other Ambulatory Visit: Payer: Self-pay | Admitting: Medical

## 2021-10-30 LAB — COMPREHENSIVE METABOLIC PANEL
ALT: 26 IU/L (ref 0–44)
AST: 18 IU/L (ref 0–40)
Albumin/Globulin Ratio: 1.7 (ref 1.2–2.2)
Albumin: 4.2 g/dL (ref 3.9–4.9)
Alkaline Phosphatase: 46 IU/L (ref 44–121)
BUN/Creatinine Ratio: 7 — ABNORMAL LOW (ref 10–24)
BUN: 8 mg/dL (ref 8–27)
Bilirubin Total: 0.8 mg/dL (ref 0.0–1.2)
CO2: 24 mmol/L (ref 20–29)
Calcium: 9.2 mg/dL (ref 8.6–10.2)
Chloride: 103 mmol/L (ref 96–106)
Creatinine, Ser: 1.17 mg/dL (ref 0.76–1.27)
Globulin, Total: 2.5 g/dL (ref 1.5–4.5)
Glucose: 117 mg/dL — ABNORMAL HIGH (ref 70–99)
Potassium: 4.5 mmol/L (ref 3.5–5.2)
Sodium: 139 mmol/L (ref 134–144)
Total Protein: 6.7 g/dL (ref 6.0–8.5)
eGFR: 69 mL/min/{1.73_m2} (ref >59)

## 2021-10-30 LAB — LIPID PANEL
Chol/HDL Ratio: 7.7 ratio — ABNORMAL HIGH (ref 0.0–5.0)
Cholesterol, Total: 176 mg/dL (ref 100–199)
HDL: 23 mg/dL — ABNORMAL LOW (ref >39)
LDL Chol Calc (NIH): 121 mg/dL — ABNORMAL HIGH (ref 0–99)
Triglycerides: 177 mg/dL — ABNORMAL HIGH (ref 0–149)
VLDL Cholesterol Cal: 32 mg/dL (ref 5–40)

## 2021-10-30 LAB — CBC
Hematocrit: 48 % (ref 37.5–51.0)
Hemoglobin: 16.1 g/dL (ref 13.0–17.7)
MCH: 31.4 pg (ref 26.6–33.0)
MCHC: 33.5 g/dL (ref 31.5–35.7)
MCV: 94 fL (ref 79–97)
Platelets: 202 10*3/uL (ref 150–450)
RBC: 5.13 x10E6/uL (ref 4.14–5.80)
RDW: 12.7 % (ref 11.6–15.4)
WBC: 6.9 10*3/uL (ref 3.4–10.8)

## 2021-10-30 LAB — HEMOGLOBIN A1C
Est. average glucose Bld gHb Est-mCnc: 128 mg/dL
Hgb A1c MFr Bld: 6.1 % — ABNORMAL HIGH (ref 4.8–5.6)

## 2021-10-30 MED ORDER — ROSUVASTATIN CALCIUM 20 MG PO TABS: 20.0000 mg | ORAL_TABLET | Freq: Every day | ORAL | 3 refills | Status: DC

## 2021-10-30 MED ORDER — GEMFIBROZIL 600 MG PO TABS: 600.0000 mg | ORAL_TABLET | Freq: Two times a day (BID) | ORAL | 2 refills | Status: DC

## 2021-11-03 ENCOUNTER — Other Ambulatory Visit: Payer: Self-pay | Admitting: Medical

## 2021-12-14 ENCOUNTER — Other Ambulatory Visit: Payer: Self-pay | Admitting: Medical

## 2021-12-14 ENCOUNTER — Telehealth: Payer: Self-pay | Admitting: Medical

## 2021-12-14 ENCOUNTER — Telehealth (INDEPENDENT_AMBULATORY_CARE_PROVIDER_SITE_OTHER): Payer: Medicare HMO | Admitting: Medical

## 2021-12-14 DIAGNOSIS — R0982 Postnasal drip: Secondary | ICD-10-CM | POA: Diagnosis not present

## 2021-12-14 DIAGNOSIS — U071 COVID-19: Secondary | ICD-10-CM | POA: Diagnosis not present

## 2021-12-14 MED ORDER — AZITHROMYCIN 500 MG PO TABS: ORAL_TABLET | ORAL | 0 refills | Status: DC

## 2021-12-14 MED ORDER — MOLNUPIRAVIR EUA 200MG CAPSULE: 4.0000 | ORAL_CAPSULE | Freq: Two times a day (BID) | ORAL | 0 refills | Status: AC

## 2021-12-14 NOTE — Progress Notes (Signed)
Subjective:     Patient ID: Mark Valentine, male   DOB: 02/18/1956, 66 y.o.   MRN: [id number]  This visit type was conducted due to national recommendations for restrictions regarding the COVID-19 Pandemic (e.g. social distancing) in an effort to limit this patient's exposure and mitigate transmission in our community.  Due to their co-morbid illnesses, this patient is at least at moderate risk for complications without adequate follow up.  This format is felt to be most appropriate for this patient at this time.    Documentation for virtual audio and video telecommunications through [place] encounter:  The patient was located at home. The provider was located in the office. The patient did consent to this visit and is aware of possible charges through their insurance for this visit.  The other persons participating in this telemedicine service were none. Time spent on call was 20 minutes and in review of previous records 20 minutes total.  This virtual service is not related to other E/M service within previous 7 days.   HPI Chief Complaint  Patient presents with   Covid Positive    Covid positive- as of today. Symptoms Friday-  weakness, sore throat, eye watery, cough, congestion.   Virtual visit for illness.  Had covid vaccine 2 weeks ago.  Has had other covid vaccine prior as well.   He notes real sore throat, some post nasal drainage, cough, eyes watery, some runny nose, coughing some.  Feels weak.  Has used tylenol and hydration over the weekend.  Started getting sick Friday 3 days ago.  Appetite is ok.   Had a covid contact last week, 4 coworkers had it last week.   No NVD.  No wheezing, but felt slight SOB this morning for the first time.  Sore throat is moderate.  No other aggravating or relieving factors.  No other complaint.  Past Medical History:  Diagnosis Date   Anal fissure 1980   Hip pain, bilateral    pending surgery 09/2015, [facility]   History of testicular  cancer    Hyperlipidemia    Hypertension 09/24/2015   no meds currently-elevates with MD visits.   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 For triglycerides and lipids 60 tablet 2   losartan (COZAAR) 50 MG tablet TAKE 1 TABLET BY MOUTH EVERY DAY 90 tablet 0   rosuvastatin (CRESTOR) 20 MG tablet Take 1 tablet (20 mg total) by mouth daily. 90 tablet 3   No current facility-administered medications on file prior to visit.    Review of Systems As in subjective    Objective:   Physical Exam Due to coronavirus pandemic stay at home measures, patient visit was virtual and they were not examined in person.   There were no vitals taken for this visit.       Assessment:     Encounter Diagnoses  Name Primary?   COVID Yes   Post-nasal drainage         Plan:      General recommendations: I recommend you rest, hydrate well with water and clear fluids throughout the day.   You can use Tylenol for pain or  fever You can use over the counter Delsym for cough. You can use over the counter Emetrol for nausea.     Begin medication below for reducing risk of severe disease.  If you are having trouble breathing, if you are very weak, have high fever 103 or higher consistently despite Tylenol, or uncontrollable nausea and vomiting, then call or go to the emergency department.    If you have other questions or have other symptoms or questions you are concerned about then please make a virtual visit  Covid symptoms such as fatigue and cough can linger over 2 weeks, even after the initial fever, aches, chills, and other initial symptoms.   Self Quarantine: The CDC, Centers for Disease Control has  recommended a self quarantine of 5 days from the start of your illness until you are symptom-free including at least 24 hours of no symptoms including no fever, no shortness of breath, and no body aches and chills, by day 5 before returning to work or general contact with the public.  What does self quarantine mean: avoiding contact with people as much as possible.   Particularly in your house, isolate your self from others in a separate room, wear a mask when possible in the room, particularly if coughing a lot.   Have others bring food, water, medications, etc., to your door, but avoid direct contact with your household contacts during this time to avoid spreading the infection to them.   If you have a separate bathroom and living quarters during the next 2 weeks away from others, that would be preferable.    If you can't completely isolate, then wear a mask, wash hands frequently with soap and water for at least 15 seconds, minimize close contact with others, and have a friend or family member check regularly from a distance to make sure you are not getting seriously worse.     You should not be going out in public, should not be going to stores, to work or other public places until all your symptoms have resolved and at least 5 days + 24 hours of no symptoms at all have transpired.   Ideally you should avoid contact with others for a full 5 days if possible.  One of the goals is to limit spread to high risk people; people that are older and elderly, people with multiple health issues like diabetes, heart disease, lung disease, and anybody that has weakened immune systems such as people with cancer or on immunosuppressive therapy.     Mark Valentine was seen today for covid positive.  Diagnoses and all orders for this visit:  COVID  Post-nasal drainage  Other orders -     molnupiravir EUA (LAGEVRIO) 200 mg CAPS capsule; Take 4 capsules (800 mg total) by mouth 2 (two) times daily for 5 days.    F/u  prn

## 2021-12-14 NOTE — Telephone Encounter (Signed)
Pt called in after virtual appointment and asks if you can prescribe him something for his throat as well.Marland Kitchen

## 2021-12-14 NOTE — Progress Notes (Signed)
zpax

## 2022-01-07 DIAGNOSIS — L918 Other hypertrophic disorders of the skin: Secondary | ICD-10-CM | POA: Diagnosis not present

## 2022-01-14 DIAGNOSIS — B078 Other viral warts: Secondary | ICD-10-CM | POA: Diagnosis not present

## 2022-01-18 ENCOUNTER — Other Ambulatory Visit: Payer: Self-pay | Admitting: Medical

## 2022-01-28 ENCOUNTER — Encounter: Payer: Self-pay | Admitting: Medical

## 2022-01-28 ENCOUNTER — Telehealth (INDEPENDENT_AMBULATORY_CARE_PROVIDER_SITE_OTHER): Payer: Medicare HMO | Admitting: Medical

## 2022-01-28 VITALS — BP 130/88 | HR 81 | Temp 97.9°F | Resp 18 | Wt 254.4 lb

## 2022-01-28 DIAGNOSIS — R059 Cough, unspecified: Secondary | ICD-10-CM | POA: Diagnosis not present

## 2022-01-28 DIAGNOSIS — R6889 Other general symptoms and signs: Secondary | ICD-10-CM | POA: Diagnosis not present

## 2022-01-28 LAB — POCT INFLUENZA A/B
Influenza A, POC: NEGATIVE
Influenza B, POC: NEGATIVE

## 2022-01-28 LAB — POC COVID19 BINAXNOW: SARS Coronavirus 2 Ag: NEGATIVE

## 2022-01-28 NOTE — Progress Notes (Signed)
Subjective:     Patient ID: Mark Valentine, male   DOB: 15-Dec-1955, 66 y.o.   MRN: [id number]  This visit type was conducted due to national recommendations for restrictions regarding the COVID-19 Pandemic (e.g. social distancing) in an effort to limit this patient's exposure and mitigate transmission in our community.  Due to their co-morbid illnesses, this patient is at least at moderate risk for complications without adequate follow up.  This format is felt to be most appropriate for this patient at this time.    Documentation for virtual audio and video telecommunications through [place] encounter:  The patient was located at home. The provider was located in the office. The patient did consent to this visit and is aware of possible charges through their insurance for this visit.  The other persons participating in this telemedicine service were none. Time spent on call was 20 minutes and in review of previous records 20 minutes total.  This virtual service is not related to other E/M service within previous 7 days.   HPI Chief Complaint  Patient presents with   sinus build up    Sinus build up- started yesterday, no sinus pressure, no pain, no cough, no other symptoms. Taking sudafed and mucinex. Been drinking lots of fluids   Virtual for possible sinus infection.  Having 1 day yesterday of feeling bad.   Feels some sinus pressure, congestion, drainage . No sore throat.  Has a little hoarseness.  Using some Mucinex, some Tylenol, some sudafed as well.  No fever, no chills, but some aches.  No NVD.  No loss of smell or taste, no major headaches.   No recent covid test.  Had covid back in 11/2021.  No other aggravating or relieving factors. No other complaint.   Review of Systems As in subjective  Past Medical History:  Diagnosis Date   Anal fissure 1980   Hip pain, bilateral    pending surgery 09/2015, [facility]   History of testicular cancer    Hyperlipidemia     Hypertension 09/24/2015   no meds currently-elevates with MD visits.   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 For triglycerides and lipids 60 tablet 2   losartan (COZAAR) 50 MG tablet TAKE 1 TABLET BY MOUTH EVERY DAY 90 tablet 1   rosuvastatin (CRESTOR) 20 MG tablet Take 1 tablet (20 mg total) by mouth daily. (Patient taking differently: Take 20 mg by mouth daily. Takes 3 times a  week) 90 tablet 3   No current facility-administered medications on file prior to visit.       Objective:   Physical Exam Due to coronavirus pandemic stay at home measures, patient visit was virtual and they were not examined in person.   BP 130/88   Pulse 81   Temp 97.9 F (36.6 C)   Resp 18   Wt 254 lb 6.4 oz (115.4 kg)   SpO2 96%   BMI 38.68 kg/m   Gen: wd, wn, nad No witnessed wheezing or labored breathing      Assessment:     Encounter Diagnoses  Name Primary?   Cough, unspecified type Yes   Flu-like  symptoms        Plan:     We discussed symptoms and concerns.  He will come to our back parking lot shortly for testing for flu and COVID.  We discussed rest, hydration, vitamin pack over-the-counter such as emergenC, and continue Tylenol as needed.  Avoid Sudafed given high blood pressure.  We discussed using either Mucinex DM or Coricidin HBP to avoid elevated blood pressure.  Follow-up pending testing here in our office today  At our office had a negative COVID and flu test.  He was examined and his lungs were clear, HEENT otherwise unremarkable.  Advised his symptoms suggest a viral URI.  Discussed supportive care, rest, and if new symptoms or worsen in the next 3 to 5 days then call back or  recheck or call after-hours line  Hassaan was seen today for sinus build up.  Diagnoses and all orders for this visit:  Cough, unspecified type -     POC COVID-19 -     Influenza A/B  Flu-like symptoms -     POC COVID-19 -     Influenza A/B    F/u prn

## 2022-01-30 ENCOUNTER — Other Ambulatory Visit: Payer: Self-pay | Admitting: Medical

## 2022-02-04 ENCOUNTER — Other Ambulatory Visit: Payer: Self-pay | Admitting: Medical

## 2022-02-04 ENCOUNTER — Telehealth: Payer: Self-pay | Admitting: Medical

## 2022-02-04 MED ORDER — AZITHROMYCIN 250 MG PO TABS: ORAL_TABLET | ORAL | 0 refills | Status: DC

## 2022-02-04 NOTE — Telephone Encounter (Signed)
Pt informed

## 2022-02-04 NOTE — Telephone Encounter (Signed)
Pt called congestion, heavy mucous can't hear good, stopped up, can't get congestion out.  Taking Mucinex DM & not helping.  Not coughing, hack occasionally.  Would like you to call in something stronger to help with congestion.  Said last time you gave him Z-pac worked really well.

## 2022-02-11 ENCOUNTER — Ambulatory Visit (INDEPENDENT_AMBULATORY_CARE_PROVIDER_SITE_OTHER): Payer: Medicare HMO | Admitting: Medical

## 2022-02-11 ENCOUNTER — Encounter: Payer: Self-pay | Admitting: Medical

## 2022-02-11 VITALS — BP 130/78 | HR 74 | Ht 67.99 in | Wt 249.2 lb

## 2022-02-11 DIAGNOSIS — R7301 Impaired fasting glucose: Secondary | ICD-10-CM

## 2022-02-11 DIAGNOSIS — E782 Mixed hyperlipidemia: Secondary | ICD-10-CM | POA: Diagnosis not present

## 2022-02-11 DIAGNOSIS — R0981 Nasal congestion: Secondary | ICD-10-CM

## 2022-02-11 DIAGNOSIS — I10 Essential (primary) hypertension: Secondary | ICD-10-CM | POA: Diagnosis not present

## 2022-02-11 NOTE — Progress Notes (Signed)
Subjective:  Mark Valentine is a 66 y.o. male who presents for Chief Complaint  Patient presents with   Follow-up    Medication recheck.     Here for med check.  Hyperlipidemia-compliant with gemfibrozil 600 mg twice a day and Crestor 20 mg daily without complaint.  Gemfibrozil was started in August 2023 given not improving with triglycerides with diet and fish oil was not covered by insurance.  Hypertension-compliant with losartan 50 mg daily  Prediabetes- here for recheck on lab  I saw him recently for head congestion.  He is still stopped up feeling despite antiibot.  No other aggravating or relieving factors.    No other c/o.  Past Medical History:  Diagnosis Date   Anal fissure 1980   Hip pain, bilateral    pending surgery 09/2015, [facility]   History of testicular cancer    Hyperlipidemia    Hypertension 09/24/2015   no meds currently-elevates with MD visits.   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 FOR TRIGLYCERIDES AND LIPIDS 60 tablet 2   losartan (COZAAR) 50 MG tablet TAKE 1 TABLET BY MOUTH EVERY DAY 90 tablet 1   rosuvastatin (CRESTOR) 20 MG tablet Take 1 tablet (20 mg total) by mouth daily. (Patient taking differently: Take 20 mg by mouth daily. Takes 3 times a  week) 90 tablet 3   No current facility-administered medications on file prior to visit.    The following portions of the patient's history were reviewed and updated as appropriate: allergies, current medications, past family history, past medical history, past social history, past surgical history and problem list.  ROS Otherwise as in subjective above    Objective: BP 130/78   Pulse 74   Ht 5'  7.99" (1.727 m)   Wt 249 lb 3.2 oz (113 kg)   SpO2 95% Comment: room air  BMI 37.90 kg/m   BP Readings from Last 3 Encounters:  02/11/22 130/78  01/28/22 130/88  10/29/21 134/88   General appearance: alert, no distress, well developed, well nourished HEENT: normocephalic, sclerae anicteric, conjunctiva pink and moist, TMs pearly, nares with turbinated edema, but  no discharge, no erythema, pharynx normal Oral cavity: MMM, no lesions Neck: supple, no lymphadenopathy, no thyromegaly, no masses Heart: RRR, normal S1, S2, no murmurs Lungs: CTA bilaterally, no wheezes, rhonchi, or rales Pulses: 2+ radial pulses, 2+ pedal pulses, normal cap refill Ext: no edema   Assessment: Encounter Diagnoses  Name Primary?   Impaired fasting blood sugar Yes   Essential hypertension    Mixed dyslipidemia    Head congestion      Plan: Hypertension - not at goal.  Increase Losartan to '100mg'$  daily  Hyperlipidemia- updated labs today since starting lopid added to statin in 09/2021  Patient Instructions  Recommendations: For the next 2 weeks, use 2 tablets of the Losartan '50mg'$  to equal '100mg'$ .   Monitor your blood pressures We want to get between 110/70-130/80.  We don't really want to be over 130/80 If you start feeling dizzy or lightheaded particularly if pressure is under 110/70 then we would not use a higher dose.   However, if you numbers are  at goal on taking 2 of the '50mg'$  Losartan without side effects, then I can refill at the '100mg'$  upon next refill   Head congestion Begin over the counter Flonase or Nasacort nasal spray in the morning along with night time allergy pill such as zyrtec, allegra, or xyzal, or even benadryl Do this regimen for a week or 2 until you no longer feel stopped    Mark Valentine was seen today for follow-up.  Diagnoses and all orders for this visit:  Impaired fasting blood sugar -     Hemoglobin A1c  Essential hypertension  Mixed dyslipidemia -     Lipid  panel -     ALT  Head congestion   Follow up: pending labs

## 2022-02-11 NOTE — Patient Instructions (Signed)
Recommendations: For the next 2 weeks, use 2 tablets of the Losartan '50mg'$  to equal '100mg'$ .   Monitor your blood pressures We want to get between 110/70-130/80.  We don't really want to be over 130/80 If you start feeling dizzy or lightheaded particularly if pressure is under 110/70 then we would not use a higher dose.   However, if you numbers are at goal on taking 2 of the '50mg'$  Losartan without side effects, then I can refill at the '100mg'$  upon next refill   Head congestion Begin over the counter Flonase or Nasacort nasal spray in the morning along with night time allergy pill such as zyrtec, allegra, or xyzal, or even benadryl Do this regimen for a week or 2 until you no longer feel stopped

## 2022-02-12 LAB — LIPID PANEL
Chol/HDL Ratio: 6.3 ratio — ABNORMAL HIGH (ref 0.0–5.0)
Cholesterol, Total: 139 mg/dL (ref 100–199)
HDL: 22 mg/dL — ABNORMAL LOW (ref >39)
LDL Chol Calc (NIH): 90 mg/dL (ref 0–99)
Triglycerides: 154 mg/dL — ABNORMAL HIGH (ref 0–149)
VLDL Cholesterol Cal: 27 mg/dL (ref 5–40)

## 2022-02-12 LAB — HEMOGLOBIN A1C
Est. average glucose Bld gHb Est-mCnc: 131 mg/dL
Hgb A1c MFr Bld: 6.2 % — ABNORMAL HIGH (ref 4.8–5.6)

## 2022-02-12 LAB — ALT: ALT: 27 IU/L (ref 0–44)

## 2022-02-12 NOTE — Progress Notes (Signed)
Labs overall ok.  Continue current medications including the lopid/gemfibrozil.  Work on efforts to get some more weight off and get regular exercise including martial arts that he has done prior.     Follow up yearly for physical

## 2022-02-15 ENCOUNTER — Telehealth: Payer: Self-pay | Admitting: Medical

## 2022-02-15 NOTE — Telephone Encounter (Signed)
Pt states he increased the Losartan to '100mg'$  Fri & Sat & had one episode of sweating & little dizzy Saturday so didn't take '100mg'$  Sunday wanted to call you & let you know & see what you wanted him to do?  Please call pt & advise

## 2022-02-17 NOTE — Telephone Encounter (Signed)
Pt called back, I informed him of previous message. He says he went back to taking 50 mg because he didn't like the way 100 mg made him feel and he did take some vitals when he was feeling light headed laying down. One reading was 117/74 and the other he said moments later was 129/74.

## 2022-02-23 NOTE — Telephone Encounter (Signed)
Called pt and he is still taking the '50mg'$  and doing fine now, no more light headed or weird feeling, max BP has been was 132/74.  Advised to continue with the '50mg'$ 

## 2022-03-20 ENCOUNTER — Emergency Department (HOSPITAL_COMMUNITY): Payer: Medicare HMO

## 2022-03-20 ENCOUNTER — Emergency Department (HOSPITAL_COMMUNITY)
Admission: EM | Admit: 2022-03-20 | Discharge: 2022-03-20 | Disposition: A | Discharge status: Home / Self Care | Payer: Medicare HMO | Attending: Emergency Medicine | Admitting: Emergency Medicine

## 2022-03-20 ENCOUNTER — Encounter (HOSPITAL_COMMUNITY): Payer: Self-pay

## 2022-03-20 ENCOUNTER — Other Ambulatory Visit: Payer: Self-pay

## 2022-03-20 DIAGNOSIS — I10 Essential (primary) hypertension: Secondary | ICD-10-CM | POA: Insufficient documentation

## 2022-03-20 DIAGNOSIS — R0789 Other chest pain: Secondary | ICD-10-CM | POA: Diagnosis not present

## 2022-03-20 DIAGNOSIS — R079 Chest pain, unspecified: Secondary | ICD-10-CM

## 2022-03-20 LAB — BASIC METABOLIC PANEL
Anion gap: 8 (ref 5–15)
BUN: 13 mg/dL (ref 8–23)
CO2: 23 mmol/L (ref 22–32)
Calcium: 9 mg/dL (ref 8.9–10.3)
Chloride: 106 mmol/L (ref 98–111)
Creatinine, Ser: 1.16 mg/dL (ref 0.61–1.24)
GFR, Estimated: >60 mL/min (ref >60)
Glucose, Bld: 106 mg/dL — ABNORMAL HIGH (ref 70–99)
Potassium: 3.9 mmol/L (ref 3.5–5.1)
Sodium: 137 mmol/L (ref 135–145)

## 2022-03-20 LAB — CBC
HCT: 49.7 % (ref 39.0–52.0)
Hemoglobin: 16.7 g/dL (ref 13.0–17.0)
MCH: 31.7 pg (ref 26.0–34.0)
MCHC: 33.6 g/dL (ref 30.0–36.0)
MCV: 94.3 fL (ref 80.0–100.0)
Platelets: 224 10*3/uL (ref 150–400)
RBC: 5.27 MIL/uL (ref 4.22–5.81)
RDW: 13.2 % (ref 11.5–15.5)
WBC: 6.9 10*3/uL (ref 4.0–10.5)
nRBC: 0 % (ref 0.0–0.2)

## 2022-03-20 LAB — TROPONIN I (HIGH SENSITIVITY)
Troponin I (High Sensitivity): 5 ng/L (ref <18)
Troponin I (High Sensitivity): 6 ng/L (ref <18)
Troponin I (High Sensitivity): 4 ng/L (ref <18)

## 2022-03-20 NOTE — ED Triage Notes (Signed)
States that he woke up at 0300 with severe chest pain, states he has taken '324mg'$  of asp. He also states that the pain has relieved some but it comes and goes in severity

## 2022-03-20 NOTE — ED Notes (Addendum)
Pt transported to xray 

## 2022-03-20 NOTE — Discharge Instructions (Addendum)
You are seen today for chest pain.  Your symptoms have resolved.  We discussed keeping you in the hospital for observation versus having you follow-up with cardiology given your elevated risk for heart disease.  After conversation and reassuring lab workup, you have elected for outpatient follow-up with cardiology with strict return precautions if your symptoms were to worsen.  It was a pleasure taking care of you today, please return with any worsening symptoms, Tretha Sciara MD

## 2022-03-20 NOTE — ED Provider Triage Note (Signed)
Emergency Medicine Provider Triage Evaluation Note  Mark Valentine , a 67 y.o. male  was evaluated in triage.  Pt complains of sudden onset of chest pain that woke the patient up at 3 AM today.  Pain is located in the center of his chest, intermittent, tightness sensation, worse with lying down, better when sitting up.  Denies fever, shortness of breath, nausea, vomiting, extremity weakness numbness, diaphoresis.  Patient took aspirin this morning.  Review of Systems  Positive: As above Negative: As above  Physical Exam  BP (!) 168/100 (BP Location: Left Arm)   Pulse 84   Temp 98.8 F (37.1 C)   Resp 16   SpO2 100%  Gen:   Awake, no distress   Resp:  Normal effort  MSK:   Moves extremities without difficulty  Other:    Medical Decision Making  Medically screening exam initiated at 3:09 PM.  Appropriate orders placed.  Mark Valentine was informed that the remainder of the evaluation will be completed by another provider, this initial triage assessment does not replace that evaluation, and the importance of remaining in the ED until their evaluation is complete.    Mark Valentine, [place] 03/20/22 1511

## 2022-03-20 NOTE — ED Provider Notes (Signed)
[facility] Provider Note   CSN: [id number] Arrival date & time: 03/20/22  1452     History Chief Complaint  Patient presents with   Chest Pain    HPI Mark Valentine is a 67 y.o. male presenting for chest pain.  He is a 67 year old male with a history of hypertension, hyperlipidemia presenting with left-sided chest tightness this morning between 3 AM to 6 AM now down to a 2 out of 10 though it peaked at 8 out of 10.  There is a history of similar symptoms 5 years ago with a benign workup. Send urgent care recommended coming to the emergency department for further care and management.  He denies fevers or chills, nausea vomiting, syncope shortness of breath.  No history of blood clots no lower extremity swelling or edema.  Denies hormone use.   Patient's recorded medical, surgical, social, medication list and allergies were reviewed in the Snapshot window as part of the initial history.   Review of Systems   Review of Systems  Constitutional:  Negative for chills and fever.  HENT:  Negative for ear pain and sore throat.   Eyes:  Negative for pain and visual disturbance.  Respiratory:  Positive for chest tightness. Negative for cough and shortness of breath.   Cardiovascular:  Positive for chest pain. Negative for palpitations.  Gastrointestinal:  Negative for abdominal pain and vomiting.  Genitourinary:  Negative for dysuria and hematuria.  Musculoskeletal:  Negative for arthralgias and back pain.  Skin:  Negative for color change and rash.  Neurological:  Negative for seizures and syncope.  All other systems reviewed and are negative.   Physical Exam Updated Vital Signs BP 125/81   Pulse 65   Temp 98.1 F (36.7 C) (Oral)   Resp 16   Ht '5\' 9"'$  (1.753 m)   Wt 109.8 kg   SpO2 98%   BMI 35.74 kg/m  Physical Exam Vitals and nursing note reviewed.  Constitutional:      General: He is not in acute distress.    Appearance: He  is well-developed.  HENT:     Head: Normocephalic and atraumatic.  Eyes:     Conjunctiva/sclera: Conjunctivae normal.  Cardiovascular:     Rate and Rhythm: Normal rate and regular rhythm.     Heart sounds: No murmur heard. Pulmonary:     Effort: Pulmonary effort is normal. No respiratory distress.     Breath sounds: Normal breath sounds.  Abdominal:     Palpations: Abdomen is soft.     Tenderness: There is no abdominal tenderness.  Musculoskeletal:        General: No swelling.     Cervical back: Neck supple.  Skin:    General: Skin is warm and dry.     Capillary Refill: Capillary refill takes less than 2 seconds.  Neurological:     Mental Status: He is alert.  Psychiatric:        Mood and Affect: Mood normal.      ED Course/ Medical Decision Making/ A&P    Procedures Procedures   Medications Ordered in ED Medications - No data to display Medical Decision Making: Mark Valentine is a 67 y.o. male who presented to the ED today with chest pain, detailed above.  Based on patient's comorbidities, patient has a heart score of 4.    Patient's presentation is complicated by their history of multiple comorbid medical problems.  Patient placed on continuous vitals and  telemetry monitoring while in ED which was reviewed periodically.  Complete initial physical exam performed, notably the patient was grossly asymptomatic in no acute distress..   Reviewed and confirmed nursing documentation for past medical history, family history, social history.    Initial Assessment: With the patient's presentation of left-sided chest pain, most likely diagnosis is musculoskeletal chest pain versus GERD, although ACS remains on the differential. Other diagnoses were considered including (but not limited to) pulmonary embolism, community-acquired pneumonia, aortic dissection, pneumothorax, underlying bony abnormality, anemia. These are considered less likely due to history of present illness and  physical exam findings.    In particular, concerning pulmonary embolism: Patient is denying malignancy, recent surgery, history of DVT, or calf tenderness leading to a low risk Wells score. Aortic Dissection also reconsidered but seems less likely based on the location, quality, onset, and severity of symptoms in this case. Patient also has a lack of underlying history of AD or TAA.  This is most consistent with an acute life/limb threatening illness complicated by underlying chronic conditions.   Initial Plan: Evaluate for ACS with delta troponin and EKG evaluated as below  Evaluate for dissection, bony abnormality, or pneumonia with chest x-ray and screening laboratory evaluation including CBC, BMP  Further evaluation for pulmonary embolism not indicated at this time based on patient's PERC and Wells score.  Further evaluation for Thoracic Aortic Dissection not indicated at this time based on patient's clinical history and PE findings.   Initial Study Results: EKG was reviewed independently. Rate, rhythm, axis, intervals all examined and without medically relevant abnormality. ST segments without concerns for elevations.    Laboratory  Delta troponin demonstrated no acute abnormalities.  Third troponin added on for completion of evaluation  CBC and BMP without obvious metabolic or inflammatory abnormalities requiring further evaluation   Radiology  DG Chest 2 View  Result Date: 03/20/2022 CLINICAL DATA:  Chest pain. EXAM: CHEST - 2 VIEW COMPARISON:  Chest radiograph 12/29/2016 FINDINGS: The heart size and mediastinal contours are within normal limits. Both lungs are clear. The visualized skeletal structures are unremarkable. IMPRESSION: No active cardiopulmonary disease. Electronically Signed   By: Lovey Newcomer M.D.   On: 03/20/2022 15:52    Final Assessment and Plan: Patient observed in the emergency room for a total of 5 and half hours.  Chest pain completely resolved since our 3.   Uncertain what caused patient syndrome this morning.  Does not appear to be ACS based on resolution, normal troponins x 3.  Had a shared medical decision making conversation with patient.  He has a heart score 4 which puts him in the indeterminate risk category.  Offered observation admission versus close outpatient follow-up with a cardiologist and patient elected for cardiology follow-up. Strict return precautions reinforced and patient expressed understanding.  They will plan to arrange very close follow-up with cardiology. Disposition:  I have considered need for hospitalization, however, considering all of the above, I believe this patient is stable for discharge at this time.  Patient/family educated about specific return precautions for given chief complaint and symptoms.  Patient/family educated about follow-up with PCP and cardiology.     Patient/family expressed understanding of return precautions and need for follow-up. Patient spoken to regarding all imaging and laboratory results and appropriate follow up for these results. All education provided in verbal form with additional information in written form. Time was allowed for answering of patient questions. Patient discharged.    Emergency Department Medication Summary:   Medications -  No data to display     Clinical Impression:  1. Chest pain, unspecified type      Data Unavailable   Final Clinical Impression(s) / ED Diagnoses Final diagnoses:  Chest pain, unspecified type    Rx / DC Orders ED Discharge Orders     None         Tretha Sciara, MD 03/20/22 2018

## 2022-03-21 DIAGNOSIS — R079 Chest pain, unspecified: Secondary | ICD-10-CM | POA: Diagnosis not present

## 2022-03-23 ENCOUNTER — Telehealth: Payer: Self-pay

## 2022-03-23 NOTE — Patient Outreach (Signed)
  Care Coordination [facility] Note Transition Care Management Follow-up Telephone Call  Follow Up Call   Incoming call form patient. States he was able to speak with someone at cardiology office using number RN CM provided. Patient voices that he has appt scheduled to see them on 03/31/22 a-as that is the earliest appt available. Patent states he was told he would have to have another EKG done. He also voices that office has not gotten his paperwork/medical info from ED visit but was told it may take about 3 days for them to get it. He denies any further RN CM needs or concerns at this time.    Care Coordination Interventions:  Education provided    Encounter Outcome:  Pt. Visit Completed    Enzo Montgomery, RN,BSN,CCM [place] Management Telephonic Care Management Coordinator Direct Phone: [phone] Toll Free: [phone] Fax: [phone]

## 2022-03-23 NOTE — Patient Outreach (Signed)
  Care Coordination TOC Note Transition Care Management Follow-up Telephone Call Date of discharge and from where: 03/20/22-[facility]   Dx:"unspecified chest pain" Red on EMMI-ED Discharge Alert Reason: "Scheduled follow-up appt? No" Red Alert Date: 03/22/22 How have you been since you were released from the hospital? Patient states he has been doing good. No further cardiac issues since returning home.  Any questions or concerns? Yes-Patient states wife tried to call to make cardiology appt at number listed on d/c instructions([phone]) and number not in service. RN CM attempted number while on phone with patient and not in service. RN CM looked for alternate number to [facility] office and provided patient with number [phone]. He will call office ASAP to make an appt.  Items Reviewed: Did the pt receive and understand the discharge instructions provided? Yes  Medications obtained and verified? No -patient driving and not at home Other? No  Any new allergies since your discharge? No  Dietary orders reviewed? Yes Do you have support at home? Yes   Home Care and Equipment/Supplies: Were home health services ordered? not applicable If so, what is the name of the agency? N/A  Has the agency set up a time to come to the patient's home? not applicable Were any new equipment or medical supplies ordered?  No What is the name of the medical supply agency? N/A Were you able to get the supplies/equipment? not applicable Do you have any questions related to the use of the equipment or supplies? No  Functional Questionnaire: (I = Independent and D = Dependent) ADLs: I  Bathing/Dressing- I  Meal Prep- I  Eating- I  Maintaining continence- I  Transferring/Ambulation- I  Managing Meds- I  Follow up appointments reviewed:  PCP Hospital f/u appt confirmed? No  -Patient will call to make an appt-denies needing assistance. [facility] f/u appt confirmed? No -Patient  provided with correct number to call and make an appt. Are transportation arrangements needed? No  If their condition worsens, is the pt aware to call PCP or go to the Emergency Dept.? Yes Was the patient provided with contact information for the PCP's office or ED? Yes Was to pt encouraged to call back with questions or concerns? Yes  SDOH assessments and interventions completed:   Yes SDOH Interventions Today    Flowsheet Row Most Recent Value  SDOH Interventions   Food Insecurity Interventions Intervention Not Indicated  Transportation Interventions Intervention Not Indicated       Care Coordination Interventions:  Education provided regarding cardiac mgmt    Encounter Outcome:  Pt. Visit Completed    [facility] Telephonic Care Management Coordinator Direct Phone: [phone] Toll Free: [phone] Fax: [phone]

## 2022-03-31 ENCOUNTER — Encounter: Payer: Self-pay | Admitting: Internal Medicine

## 2022-03-31 ENCOUNTER — Ambulatory Visit: Payer: Medicare HMO | Attending: Internal Medicine | Admitting: Internal Medicine

## 2022-03-31 VITALS — BP 136/72 | HR 64 | Ht 67.0 in | Wt 249.0 lb

## 2022-03-31 DIAGNOSIS — R079 Chest pain, unspecified: Secondary | ICD-10-CM | POA: Diagnosis not present

## 2022-03-31 MED ORDER — METOPROLOL TARTRATE 50 MG PO TABS: 50.0000 mg | ORAL_TABLET | Freq: Once | ORAL | 0 refills | Status: DC

## 2022-03-31 NOTE — Patient Instructions (Signed)
Medication Instructions:  Metoprolol Tartrate '50mg'$  2 hours before Coronary CT *If you need a refill on your cardiac medications before your next appointment, please call your pharmacy*  Testing/Procedures: Your physician has requested that you have cardiac CT. Cardiac computed tomography (CT) is a painless test that uses an x-ray machine to take clear, detailed pictures of your heart. For further information please visit [email or web address]. Please follow instruction sheet as given.     Your cardiac CT will be scheduled at one of the below locations:   [facility] [address] [place], [place] [address] [phone]    If scheduled at [facility], please arrive at the [facility] and Children's Entrance (Entrance C2) of [facility] 30 minutes prior to test start time. You can use the FREE valet parking offered at entrance C (encouraged to control the heart rate for the test)  Proceed to the [facility] Radiology Department (first floor) to check-in and test prep.  All radiology patients and guests should use entrance C2 at [facility], accessed from [facility], even though the hospital's physical address listed is [address]     Please follow these instructions carefully (unless otherwise directed):   On the Night Before the Test: Be sure to Drink plenty of water. Do not consume any caffeinated/decaffeinated beverages or chocolate 12 hours prior to your test. Do not take any antihistamines 12 hours prior to your test.  On the Day of the Test: Drink plenty of water until 1 hour prior to the test. Do not eat any food 1 hour prior to test. You may take your regular medications prior to the test.  Take metoprolol (Lopressor) two hours prior to test.      After the Test: Drink plenty of water. After receiving IV contrast, you may experience a mild flushed feeling. This is normal. On occasion, you may experience a mild  rash up to 24 hours after the test. This is not dangerous. If this occurs, you can take Benadryl 25 mg and increase your fluid intake. If you experience trouble breathing, this can be serious. If it is severe call 911 IMMEDIATELY. If it is mild, please call our office. If you take any of these medications: Glipizide/Metformin, Avandament, Glucavance, please do not take 48 hours after completing test unless otherwise instructed.  We will call to schedule your test 2-4 weeks out understanding that some insurance companies will need an authorization prior to the service being performed.   For non-scheduling related questions, please contact the cardiac imaging nurse navigator should you have any questions/concerns: Marchia Bond, Cardiac Imaging Nurse Navigator Gordy Clement, Cardiac Imaging Nurse Navigator [facility] Direct Office Dial: [phone]   For scheduling needs, including cancellations and rescheduling, please call [place], [phone].    Follow-Up: At [facility], you and your health needs are our priority.  As part of our continuing mission to provide you with exceptional heart care, we have created designated Provider Care Teams.  These Care Teams include your primary Cardiologist (physician) and Advanced Practice Providers (APPs -  Physician Assistants and Nurse Practitioners) who all work together to provide you with the care you need, when you need it.  We recommend signing up for the patient portal called "MyChart".  Sign up information is provided on this After Visit Summary.  MyChart is used to connect with patients for Virtual Visits (Telemedicine).  Patients are able to view lab/test results, encounter notes,  upcoming appointments, etc.  Non-urgent messages can be sent to your provider as well.   To learn more about what you can do with MyChart, go to [email or web address].    Your next appointment:    Follow up as  needed  Provider:   Dr Harl Bowie

## 2022-03-31 NOTE — Progress Notes (Signed)
Cardiology Office Note:    Date:  03/31/2022   ID:  [name], DOB [birth year], MRN [id number]  PCP:  Carlena Hurl, PA-C   [place] Providers Cardiologist:  None     Referring MD: Tretha Sciara, MD   Chief Complaint  Patient presents with   New Patient (Initial Visit)        Chest Pain   CP  History of Present Illness:    Mark Valentine is a 67 y.o. male with a hx of prostate cancer s/p robotic prostatectomy, HTN, HLD, referral from [facility] with L chest tightness in the early AM on 03/20/2022. Troponin was negative. EKG showed sinus rhythm, no significant ischemic changes. He notes he was eating spicy food.   For exercise, he walks. He denies chest pressure or SOB. 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 He has not been up stairs recently  He had an episode 5-6 years ago that was similar. Noted a stinging cp. He was told that his work up was negative. No cardiac stress test.  Non smoker, no etoh   02/11/2022 TC 139 mg/dL HDL 22 mg/dL LDL 90 mg/dL A1c 6.2 %  Past Medical History:  Diagnosis Date   Anal fissure 1980   Hip pain, bilateral    pending surgery 09/2015, [facility]   History of testicular cancer    Hyperlipidemia    Hypertension 09/24/2015   no meds currently-elevates with MD visits.   Lipoprotein deficiencies    Low HDL (under 40)    Osteoarthritis of hip    Prostate cancer ([place]) 2017   robotic prostatectomy 06-04-15   Wears glasses     Past Surgical History:  Procedure Laterality Date   anal fistula repair  1980's   COLONOSCOPY  02/01/2011   normal, repeat 2022; Dr. Sharlett Iles   COLONOSCOPY  2023   HIP CLOSED REDUCTION Left 11/11/2015   Procedure: CLOSED REDUCTION HIP;  Surgeon: Paralee Cancel, MD;  Location: [facility];  Service: Orthopedics;  Laterality: Left;   ORCHIECTOMY  1997   prostate cancer- one testicle only   PROSTATE BIOPSY  2010   Dr. Amalia Hailey, Urology   PROSTATECTOMY  05/2015   robotic   TOTAL HIP ARTHROPLASTY  Right 09/30/2015   Procedure: RIGHT TOTAL HIP ARTHROPLASTY ANTERIOR APPROACH;  Surgeon: Paralee Cancel, MD;  Location: [facility];  Service: Orthopedics;  Laterality: Right;   TOTAL HIP ARTHROPLASTY Left 11/11/2015   Procedure: LEFT TOTAL HIP ARTHROPLASTY ANTERIOR APPROACH;  Surgeon: Paralee Cancel, MD;  Location: [facility];  Service: Orthopedics;  Laterality: Left;    Current Medications: Current Meds  Medication Sig   aspirin 325 MG tablet Take 325 mg by mouth daily.   gemfibrozil (LOPID) 600 MG tablet TAKE 1 TABLET (600 MG TOTAL) BY MOUTH 2 (TWO) TIMES DAILY. FOR TRIGLYCERIDES AND LIPIDS   losartan (COZAAR) 50 MG tablet TAKE 1 TABLET BY MOUTH EVERY DAY   metoprolol tartrate (LOPRESSOR) 50 MG tablet Take 1 tablet (50 mg total) by mouth once for 1 dose. PLEASE TAKE METOPROLOL 2  HOURS PRIOR TO CTA SCAN.   rosuvastatin (CRESTOR) 20 MG tablet Take 1 tablet (20 mg total) by mouth daily. (Patient taking differently: Take 20 mg by mouth daily. Takes 3 times a  week)     Allergies:   Mobic [meloxicam]   Social History   Socioeconomic History   Marital status: Married    Spouse name: Not on file   Number of children: 1  Years of education: Not on file   Highest education level: Not on file  Occupational History   Not on file  Tobacco Use   Smoking status: Never   Smokeless tobacco: Never  Vaping Use   Vaping Use: Never used  Substance and Sexual Activity   Alcohol use: No   Drug use: No   Sexual activity: Yes  Other Topics Concern   Not on file  Social History Narrative   Married, limited exercise, former judge.  Worked for [employer], prior worked at [employer] in [place].  First wife passed away 16-May-1999.  Has 1 daugthter.  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  Mother passed 05-16-2015.   09/2021   Social Determinants of Health   Financial Resource Strain: Not on file  Food Insecurity: No Food Insecurity (03/23/2022)   Hunger Vital Sign    Worried About Running Out of Food in the Last Year: Never  true    Ran Out of Food in the Last Year: Never true  Transportation Needs: No Transportation Needs (03/23/2022)   PRAPARE - Hydrologist (Medical): No    Lack of Transportation (Non-Medical): No  Physical Activity: Not on file  Stress: Not on file  Social Connections: Not on file     Family History: The patient's family history includes Arthritis in his mother; Diabetes in his mother and sister; Heart disease in his father; Hypertension in his brother, mother, and sister; Mental illness in his sister; Stroke in his mother. There is no history of Colon cancer, Colon polyps, Esophageal cancer, Stomach cancer, or Rectal cancer.  Father- cardiac dx, aortic dissection. Mother no CAD. HTN at an older age  ROS:   Please see the history of present illness.     All other systems reviewed and are negative.  EKGs/Labs/Other Studies Reviewed:    The following studies were reviewed today:   EKG:  EKG is  ordered today.  The ekg ordered today demonstrates   03/31/2022- NSR, no ischemic changes  Recent Labs: 02/11/2022: ALT 27 03/20/2022: BUN 13; Creatinine, Ser 1.16; Hemoglobin 16.7; Platelets 224; Potassium 3.9; Sodium 137   Recent Lipid Panel    Component Value Date/Time   CHOL 139 02/11/2022 0936   TRIG 154 (H) 02/11/2022 0936   HDL 22 (L) 02/11/2022 0936   CHOLHDL 6.3 (H) 02/11/2022 0936   CHOLHDL 5.7 (H) 02/23/2017 0846   VLDL 28 09/17/2015 1114   LDLCALC 90 02/11/2022 0936   LDLCALC 106 (H) 02/23/2017 0846     Risk Assessment/Calculations:         Physical Exam:    VS:   Vitals:   03/31/22 1026  BP: 136/72  Pulse: 64     BP 136/72 (BP Location: Left Arm, Patient Position: Sitting, Cuff Size: Large)   Pulse 64   Ht '5\' 7"'$  (1.702 m)   Wt 249 lb (112.9 kg)   BMI 39.00 kg/m     Wt Readings from Last 3 Encounters:  03/31/22 249 lb (112.9 kg)  03/20/22 242 lb (109.8 kg)  02/11/22 249 lb 3.2 oz (113 kg)     GEN:  Well nourished, well  developed in no acute distress HEENT: Normal NECK: No JVD; No carotid bruits LYMPHATICS: No lymphadenopathy CARDIAC: RRR, no murmurs, rubs, gallops RESPIRATORY:  Clear to auscultation without rales, wheezing or rhonchi  ABDOMEN: Soft, non-tender, non-distended MUSCULOSKELETAL:  No edema; No deformity  SKIN: Warm and dry NEUROLOGIC:  Alert and oriented x 3 PSYCHIATRIC:  Normal affect   ASSESSMENT:    Possible cardiac CP: symptoms c/f GERD. Considering his age, pre DM and HLD. His family is concerned and requested CT. Will obtain a coronary CTA. Blood pressure is in good control on losartan 50 mg daily. Can assess plaque burden and titrate crestor as needed. From a cardiac perspective he does not need to take asa 325 mg daily; will assess CAC to determine necessity of asa from a cardiac perspective  PLAN:    In order of problems listed above:  Coronary CTA with morph 50 mg metop tartrate Follow up as need pending the results        Medication Adjustments/Labs and Tests Ordered: Current medicines are reviewed at length with the patient today.  Concerns regarding medicines are outlined above.  Orders Placed This Encounter  Procedures   CT CORONARY MORPH W/CTA COR W/SCORE W/CA W/CM &/OR WO/CM   EKG 12-Lead   Meds ordered this encounter  Medications   metoprolol tartrate (LOPRESSOR) 50 MG tablet    Sig: Take 1 tablet (50 mg total) by mouth once for 1 dose. PLEASE TAKE METOPROLOL 2  HOURS PRIOR TO CTA SCAN.    Dispense:  1 tablet    Refill:  0    Patient Instructions  Medication Instructions:  Metoprolol Tartrate '50mg'$  2 hours before Coronary CT *If you need a refill on your cardiac medications before your next appointment, please call your pharmacy*  Testing/Procedures: Your physician has requested that you have cardiac CT. Cardiac computed tomography (CT) is a painless test that uses an x-ray machine to take clear, detailed pictures of your heart. For further information  please visit [email or web address]. Please follow instruction sheet as given.     Your cardiac CT will be scheduled at one of the below locations:   [facility] [address] [place], [place] [address] [phone]    If scheduled at [facility], please arrive at the [facility] Children[facility] Entrance (Entrance C2) of [facility] 30 minutes prior to test start time. You can use the FREE valet parking offered at entrance C (encouraged to control the heart rate for the test)  Proceed to the [facility] Radiology Department (first floor) to check-in and test prep.  All radiology patients and guests should use entrance C2 at [facility], accessed from [facility], even though the hospital's physical address listed is [address]     Please follow these instructions carefully (unless otherwise directed):   On the Night Before the Test: Be sure to Drink plenty of water. Do not consume any caffeinated/decaffeinated beverages or chocolate 12 hours prior to your test. Do not take any antihistamines 12 hours prior to your test.  On the Day of the Test: Drink plenty of water until 1 hour prior to the test. Do not eat any food 1 hour prior to test. You may take your regular medications prior to the test.  Take metoprolol (Lopressor) two hours prior to test.      After the Test: Drink plenty of water. After receiving IV contrast, you may experience a mild flushed feeling. This is normal. On occasion, you may experience a mild rash up to 24 hours after the test. This is not dangerous. If this occurs, you can take Benadryl 25 mg and increase your fluid intake. If you experience trouble breathing, this can be serious. If it is severe call 911 IMMEDIATELY. If it is mild, please call our office.  If you take any of these medications: Glipizide/Metformin, Avandament, Glucavance, please do not take 48 hours after completing test unless  otherwise instructed.  We will call to schedule your test 2-4 weeks out understanding that some insurance companies will need an authorization prior to the service being performed.   For non-scheduling related questions, please contact the cardiac imaging nurse navigator should you have any questions/concerns: Marchia Bond, Cardiac Imaging Nurse Navigator Gordy Clement, Cardiac Imaging Nurse Navigator [facility] Direct Office Dial: [phone]   For scheduling needs, including cancellations and rescheduling, please call [place], [phone].    Follow-Up: At [facility], you and your health needs are our priority.  As part of our continuing mission to provide you with exceptional heart care, we have created designated Provider Care Teams.  These Care Teams include your primary Cardiologist (physician) and Advanced Practice Providers (APPs -  Physician Assistants and Nurse Practitioners) who all work together to provide you with the care you need, when you need it.  We recommend signing up for the patient portal called "MyChart".  Sign up information is provided on this After Visit Summary.  MyChart is used to connect with patients for Virtual Visits (Telemedicine).  Patients are able to view lab/test results, encounter notes, upcoming appointments, etc.  Non-urgent messages can be sent to your provider as well.   To learn more about what you can do with MyChart, go to [email or web address].    Your next appointment:    Follow up as needed  Provider:   Dr Harl Bowie     Signed, Janina Mayo, MD  03/31/2022 11:18 AM    [place]

## 2022-04-02 ENCOUNTER — Telehealth: Payer: Self-pay | Admitting: Medical

## 2022-04-02 NOTE — Telephone Encounter (Signed)
Pt states he is running out of his Losartan because you had him take 2 a day for a while but he went back to one a day because of his sweating so now he is running out before his fill date.  Please send in early refill but you will need to change directions to 1-2 daily in order for insurance & pharmacy to let him fill early.

## 2022-04-03 NOTE — Telephone Encounter (Signed)
He is currently taking 1 Losartan '50mg'$  qd but because he took '50mg'$  BID for a while this past prescription he is going to run out early.  I can call the pharmacy & see if they can over ride it

## 2022-04-05 ENCOUNTER — Other Ambulatory Visit: Payer: Self-pay | Admitting: Medical

## 2022-04-05 MED ORDER — LOSARTAN POTASSIUM 50 MG PO TABS: 50.0000 mg | ORAL_TABLET | Freq: Two times a day (BID) | ORAL | 0 refills | Status: DC

## 2022-04-05 NOTE — Telephone Encounter (Signed)
Called pharmacy & went thru insurance, called pt and informed

## 2022-04-05 NOTE — Telephone Encounter (Signed)
Called pharmacy & they said they can not over ride this.  Called [employer] & they only way to get the Losartan to go thru early is to change the directions to BID #[id number] & it should go thru today.  Please send into [facility]

## 2022-04-06 ENCOUNTER — Telehealth: Payer: Self-pay

## 2022-04-06 MED ORDER — ASPIRIN 81 MG PO TBEC: 81.0000 mg | DELAYED_RELEASE_TABLET | Freq: Every day | ORAL | 12 refills | Status: DC

## 2022-04-06 NOTE — Telephone Encounter (Signed)
-----   Message from Janina Mayo, MD sent at 04/05/2022  4:37 PM EST ----- Regarding: RE: asa 325 mg daily Hi Renetta Suman, Please let Mrs Zerkle know that he can stop taking aspirin 325 mg daily and start taking aspirin 81 mg daily.  ----- Message ----- From: Carlena Hurl, PA-C Sent: 04/02/2022   1:17 PM EST To: Janina Mayo, MD Subject: RE: asa 325 mg daily                           I agree, '81mg'$  seems more appropriate.   I have no other reason for him to be on '325mg'$ .  Thanks Audelia Acton  ----- Message ----- From: Janina Mayo, MD Sent: 03/31/2022  11:01 AM EST To: Carlena Hurl, PA-C Subject: asa 325 mg daily                               Hi Shanon Brow, I hope you are well. We share this mutual patient Mr. Rudy. I am screening him for CAD. My suspicion is low, but we will see. Reaching out about his asa  325 mg daily. He notes his oncologist wanted him to continue this. He had a bleed he noted on meloxicam. I'm not clear on why 325 mg is recommended? Will FU the coronary CT and determine if he needs a baby aspirin. If there is no other reason for the 325 mg, I can provide recommendations for his asa.  Take Care, MB

## 2022-04-06 NOTE — Telephone Encounter (Signed)
Called and advised patient of the below- patient aware and verbalized understanding. He will switch to ASA '81mg'$  once daily. He will also verify with his oncologist when he see's him in March to make sure no other reason for the increased dose. Will forward to MD to make aware.

## 2022-04-07 ENCOUNTER — Telehealth (HOSPITAL_COMMUNITY): Payer: Self-pay | Admitting: Emergency Medicine

## 2022-04-07 NOTE — Telephone Encounter (Signed)
Reaching out to patient to offer assistance regarding upcoming cardiac imaging study; pt verbalizes understanding of appt date/time, parking situation and where to check in, pre-test NPO status and medications ordered, and verified current allergies; name and call back number provided for further questions should they arise Mark Bond RN Navigator Cardiac Imaging [facility] [phone] office [phone] cell   Arrival 1030 WC  Difficult IV  '50mg'$  metoprolol tartrate Aware contrast/nitro

## 2022-04-08 ENCOUNTER — Ambulatory Visit (HOSPITAL_COMMUNITY)
Admission: RE | Admit: 2022-04-08 | Discharge: 2022-04-08 | Disposition: A | Discharge status: Home / Self Care | Payer: Medicare HMO | Source: Ambulatory Visit | Attending: Internal Medicine | Admitting: Internal Medicine

## 2022-04-08 DIAGNOSIS — I2510 Atherosclerotic heart disease of native coronary artery without angina pectoris: Secondary | ICD-10-CM | POA: Diagnosis not present

## 2022-04-08 DIAGNOSIS — R079 Chest pain, unspecified: Secondary | ICD-10-CM | POA: Diagnosis not present

## 2022-04-08 DIAGNOSIS — I700 Atherosclerosis of aorta: Secondary | ICD-10-CM | POA: Insufficient documentation

## 2022-04-08 MED ORDER — NITROGLYCERIN 0.4 MG SL SUBL
0.8000 mg | SUBLINGUAL_TABLET | Freq: Once | SUBLINGUAL | Status: AC
  Administered 2022-04-08: 0.8 mg via SUBLINGUAL

## 2022-04-08 MED ORDER — IOHEXOL 350 MG/ML SOLN
100.0000 mL | Freq: Once | INTRAVENOUS | Status: AC | PRN
  Administered 2022-04-08: 100 mL via INTRAVENOUS

## 2022-04-08 MED ORDER — NITROGLYCERIN 0.4 MG SL SUBL
SUBLINGUAL_TABLET | SUBLINGUAL | Status: AC
  Filled 2022-04-08: qty 2

## 2022-04-14 ENCOUNTER — Telehealth: Payer: Self-pay | Admitting: Internal Medicine

## 2022-04-14 DIAGNOSIS — E782 Mixed hyperlipidemia: Secondary | ICD-10-CM

## 2022-04-14 DIAGNOSIS — Z79899 Other long term (current) drug therapy: Secondary | ICD-10-CM

## 2022-04-14 MED ORDER — ROSUVASTATIN CALCIUM 40 MG PO TABS: 40.0000 mg | ORAL_TABLET | Freq: Every day | ORAL | 3 refills | Status: DC

## 2022-04-14 NOTE — Telephone Encounter (Signed)
Patient called to say he had started the Crestor 18m today and wanted to know plan. Is he to take daily or 3 times a week like he was with the 281m  Reviewed pt. CT results and per MD note patient to increase to 40 mg PO daily and recheck lipid/liver in 3 months.   Patient aware of side effects of when to call our office and that we will mail him the lab slip. Sent in updated refill to preferred pharmacy and reminded that at refill it will be [id number]. No additional questions at this time.

## 2022-04-14 NOTE — Telephone Encounter (Signed)
Pt c/o medication issue:  1. Name of Medication: Losartan   2. How are you currently taking this medication (dosage and times per day)? 40 mg daily   3. Are you having a reaction (difficulty breathing--STAT)?   4. What is your medication issue? Patient states that he was told to call and tell how much of this medication he is taking and he would like know how long he is to continue this medication. He states he would like a call back to discuss.

## 2022-04-23 DIAGNOSIS — R972 Elevated prostate specific antigen [PSA]: Secondary | ICD-10-CM | POA: Diagnosis not present

## 2022-04-30 ENCOUNTER — Other Ambulatory Visit: Payer: Self-pay | Admitting: Medical

## 2022-05-20 DIAGNOSIS — Z8547 Personal history of malignant neoplasm of testis: Secondary | ICD-10-CM | POA: Diagnosis not present

## 2022-05-20 DIAGNOSIS — C61 Malignant neoplasm of prostate: Secondary | ICD-10-CM | POA: Diagnosis not present

## 2022-07-01 ENCOUNTER — Other Ambulatory Visit: Payer: Self-pay | Admitting: Medical

## 2022-07-30 DIAGNOSIS — Z79899 Other long term (current) drug therapy: Secondary | ICD-10-CM | POA: Diagnosis not present

## 2022-07-30 DIAGNOSIS — E782 Mixed hyperlipidemia: Secondary | ICD-10-CM | POA: Diagnosis not present

## 2022-07-31 LAB — HEPATIC FUNCTION PANEL
ALT: 36 IU/L (ref 0–44)
AST: 24 IU/L (ref 0–40)
Albumin: 4 g/dL (ref 3.9–4.9)
Alkaline Phosphatase: 47 IU/L (ref 44–121)
Bilirubin Total: 0.6 mg/dL (ref 0.0–1.2)
Bilirubin, Direct: 0.19 mg/dL (ref 0.00–0.40)
Total Protein: 6.3 g/dL (ref 6.0–8.5)

## 2022-07-31 LAB — LIPID PANEL
Chol/HDL Ratio: 3.5 ratio (ref 0.0–5.0)
Cholesterol, Total: 84 mg/dL — ABNORMAL LOW (ref 100–199)
HDL: 24 mg/dL — ABNORMAL LOW (ref >39)
LDL Chol Calc (NIH): 45 mg/dL (ref 0–99)
Triglycerides: 67 mg/dL (ref 0–149)
VLDL Cholesterol Cal: 15 mg/dL (ref 5–40)

## 2022-08-04 ENCOUNTER — Other Ambulatory Visit: Payer: Self-pay | Admitting: Medical

## 2022-08-12 ENCOUNTER — Telehealth: Payer: Self-pay | Admitting: Internal Medicine

## 2022-08-12 ENCOUNTER — Encounter: Payer: Self-pay | Admitting: Internal Medicine

## 2022-08-12 NOTE — Telephone Encounter (Signed)
Pt called in concerned because he has not heard back about his lab results. Informed him MD has not reviewed yet but once she does, they will call to review with him.

## 2022-08-13 DIAGNOSIS — M792 Neuralgia and neuritis, unspecified: Secondary | ICD-10-CM | POA: Diagnosis not present

## 2022-08-13 DIAGNOSIS — M7751 Other enthesopathy of right foot: Secondary | ICD-10-CM | POA: Diagnosis not present

## 2022-08-13 DIAGNOSIS — M7731 Calcaneal spur, right foot: Secondary | ICD-10-CM | POA: Diagnosis not present

## 2022-08-13 DIAGNOSIS — M7661 Achilles tendinitis, right leg: Secondary | ICD-10-CM | POA: Diagnosis not present

## 2022-10-14 ENCOUNTER — Other Ambulatory Visit: Payer: Self-pay | Admitting: Medical

## 2022-10-22 ENCOUNTER — Ambulatory Visit: Payer: Medicare HMO

## 2022-10-22 DIAGNOSIS — Z0000 Encounter for general adult medical examination without abnormal findings: Secondary | ICD-10-CM

## 2022-10-22 NOTE — Patient Instructions (Signed)
Mark Valentine , Thank you for taking time to come for your Medicare Wellness Visit. I appreciate your ongoing commitment to your health goals. Please review the following plan we discussed and let me know if I can assist you in the future.   Referrals/Orders/Follow-Ups/Clinician Recommendations: none  This is a list of the screening recommended for you and due dates:  Health Maintenance  Topic Date Due   COVID-19 Vaccine (7 - 2023-24 season) 01/29/2022   Flu Shot  09/30/2022   Pneumonia Vaccine (1 of 1 - PCV) 10/30/2022*   Medicare Annual Wellness Visit  10/22/2023   DTaP/Tdap/Td vaccine (2 - Td or Tdap) 10/10/2030   Colon Cancer Screening  08/28/2031   Hepatitis C Screening  Completed   Zoster (Shingles) Vaccine  Completed   HPV Vaccine  Aged Out  *Topic was postponed. The date shown is not the original due date.    Advanced directives: (ACP Link)Information on Advanced Care Planning can be found at [facility] Directives Advance Health Care Directives ([email or web address]   Next Medicare Annual Wellness Visit scheduled for next year: No, schedule is not open for Thursday or Friday for next year  insert Preventive Care attachment Insert FALL PREVENTION attachment if needed

## 2022-10-22 NOTE — Progress Notes (Signed)
Subjective:   Mark Valentine is a 67 y.o. male who presents for Medicare Annual/Subsequent preventive examination.  Visit Complete: Virtual  I connected with  Corrie Dandy on 10/22/22 by a audio enabled telemedicine application and verified that I am speaking with the correct person using two identifiers.  Patient Location: Home  Provider Location: Office/Clinic  I discussed the limitations of evaluation and management by telemedicine. The patient expressed understanding and agreed to proceed.  Patient Medicare AWV questionnaire was completed by the patient on 10/22/2022; I have confirmed that all information answered by patient is correct and no changes since this date.  Vital Signs: Unable to obtain new vitals due to this being a telehealth visit.  Review of Systems     Cardiac Risk Factors include: advanced age (>86men, >51 women);dyslipidemia;hypertension;male gender     Objective:    Today's Vitals   There is no height or weight on file to calculate BMI.     10/22/2022    8:14 AM 03/20/2022    3:17 PM 10/29/2021    9:13 AM 09/27/2017    2:57 PM 12/29/2016    7:11 PM 11/11/2015    4:00 PM 11/05/2015   11:26 AM  Advanced Directives  Does Patient Have a Medical Advance Directive? No No No No No No No  Would patient like information on creating a medical advance directive?   Yes (ED - Information included in AVS) No - Patient declined  No - patient declined information No - patient declined information    Current Medications (verified) Outpatient Encounter Medications as of 10/22/2022  Medication Sig   aspirin EC 81 MG tablet Take 1 tablet (81 mg total) by mouth daily. Swallow whole.   gemfibrozil (LOPID) 600 MG tablet TAKE 1 TABLET (600 MG TOTAL) BY MOUTH 2 (TWO) TIMES DAILY. FOR TRIGLYCERIDES AND LIPIDS   losartan (COZAAR) 50 MG tablet TAKE 1 TABLET BY MOUTH IN THE MORNING AND AT BEDTIME   rosuvastatin (CRESTOR) 40 MG tablet Take 1 tablet (40 mg total) by mouth daily.    metoprolol tartrate (LOPRESSOR) 50 MG tablet Take 1 tablet (50 mg total) by mouth once for 1 dose. PLEASE TAKE METOPROLOL 2  HOURS PRIOR TO CTA SCAN.   No facility-administered encounter medications on file as of 10/22/2022.    Allergies (verified) Mobic [meloxicam]   History: Past Medical History:  Diagnosis Date   Anal fissure 1980   Hip pain, bilateral    pending surgery 09/2015, [facility]   History of testicular cancer    Hyperlipidemia    Hypertension 09/24/2015   no meds currently-elevates with MD visits.   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 Jarold Motto   COLONOSCOPY  2023   HIP CLOSED REDUCTION Left 11/11/2015   Procedure: CLOSED REDUCTION HIP;  Surgeon: Durene Romans, MD;  Location: [facility];  Service: Orthopedics;  Laterality: Left;   ORCHIECTOMY  1997   prostate cancer- one testicle only   PROSTATE BIOPSY  2010   Dr. Logan Bores, Urology   PROSTATECTOMY  05/2015   robotic   TOTAL HIP ARTHROPLASTY Right 09/30/2015   Procedure: RIGHT TOTAL HIP ARTHROPLASTY ANTERIOR APPROACH;  Surgeon: Durene Romans, MD;  Location:  [facility];  Service: Orthopedics;  Laterality: Right;   TOTAL HIP ARTHROPLASTY Left 11/11/2015   Procedure: LEFT TOTAL HIP ARTHROPLASTY ANTERIOR APPROACH;  Surgeon: Durene Romans, MD;  Location: [facility];  Service: Orthopedics;  Laterality: Left;   Family History  Problem Relation Age of Onset   Arthritis Mother    Diabetes Mother    Stroke Mother    Hypertension Mother    Heart disease Father        aortic disease, possible dissection   Hypertension Sister    Mental illness Sister    Diabetes Sister    Hypertension Brother    Colon cancer Neg Hx    Colon polyps Neg Hx    Esophageal cancer Neg Hx     Stomach cancer Neg Hx    Rectal cancer Neg Hx    Social History   Socioeconomic History   Marital status: Married    Spouse name: Not on file   Number of children: 1   Years of education: Not on file   Highest education level: Not on file  Occupational History   Not on file  Tobacco Use   Smoking status: Never   Smokeless tobacco: Never  Vaping Use   Vaping status: Never Used  Substance and Sexual Activity   Alcohol use: No   Drug use: No   Sexual activity: Yes  Other Topics Concern   Not on file  Social History Narrative   Married, limited exercise, former judge.  Worked for [employer], prior worked at [employer] in [place].  First wife passed away Nov 06, 1999.  Has 1 daugthter.  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  Mother passed Nov 06, 2015.   09/2021   Social Determinants of Health   Financial Resource Strain: Low Risk  (10/22/2022)   Overall Financial Resource Strain (CARDIA)    Difficulty of Paying Living Expenses: Not hard at all  Food Insecurity: No Food Insecurity (10/22/2022)   Hunger Vital Sign    Worried About Running Out of Food in the Last Year: Never true    Ran Out of Food in the Last Year: Never true  Transportation Needs: No Transportation Needs (10/22/2022)   PRAPARE - Administrator, Civil Service (Medical): No    Lack of Transportation (Non-Medical): No  Physical Activity: Sufficiently Active (10/22/2022)   Exercise Vital Sign    Days of Exercise per Week: 5 days    Minutes of Exercise per Session: 30 min  Stress: No Stress Concern Present (10/22/2022)   [employer] of Occupational Health - Occupational Stress Questionnaire    Feeling of Stress : Not at all  Social Connections: Unknown (10/22/2022)   Social Connection and Isolation Panel [NHANES]    Frequency of Communication with Friends and Family: More than three times a week    Frequency of Social Gatherings with Friends and Family: Once a week    Attends Religious Services: Not on Administrator, sports or Organizations: Yes    Attends Engineer, structural: More than 4 times per year    Marital Status: Married    Tobacco Counseling Counseling given: Not Answered   Clinical Intake:  Pre-visit preparation completed: Yes  Pain : No/denies pain     Nutritional Risks: None Diabetes: No  How often do you need to have someone help you when you read instructions, pamphlets, or other written materials from your doctor or pharmacy?: 1 - Never  Interpreter Needed?: No  Information entered  by :: NAllen LPN   Activities of Daily Living    10/22/2022    7:51 AM 10/29/2021    9:15 AM  In your present state of health, do you have any difficulty performing the following activities:  Hearing? 0 0  Vision? 0 0  Difficulty concentrating or making decisions? 0 0  Walking or climbing stairs? 0 0  Dressing or bathing? 0 0  Doing errands, shopping? 0 0  Preparing Food and eating ? N N  Using the Toilet? N N  In the past six months, have you accidently leaked urine? N N  Do you have problems with loss of bowel control? N N  Managing your Medications? N N  Managing your Finances? N N  Housekeeping or managing your Housekeeping? N N    Patient Care Team: Tysinger, Kermit Balo, PA-C as PCP - General (Family Medicine) [facility], [facility]  Indicate any recent Medical Services you may have received from other than [facility] providers in the past year (date may be approximate).     Assessment:   This is a routine wellness examination for Alba.  Hearing/Vision screen Hearing Screening - Comments:: Denies hearing issues Vision Screening - Comments:: Regular eye exams, [facility]  Dietary issues and exercise activities discussed:     Goals Addressed             This Visit's Progress    Patient Stated       10/22/2022, wants to increase walking       Depression Screen    10/22/2022    8:15 AM 10/29/2021    9:14 AM 10/03/2020   10:18 AM  08/15/2019    1:55 PM 02/23/2017    8:16 AM  PHQ 2/9 Scores  PHQ - 2 Score 0 0 0 0 0  PHQ- 9 Score 0        Fall Risk    10/22/2022    7:51 AM 10/29/2021    9:13 AM 10/03/2020   10:18 AM 08/15/2019    1:54 PM 02/23/2017    8:16 AM  Fall Risk   Falls in the past year? 0 0 0 1 No  Number falls in past yr: 0 0 0 0   Injury with Fall? 0 0 0 1   Risk for fall due to : Medication side effect No Fall Risks No Fall Risks    Follow up Falls prevention discussed;Falls evaluation completed Falls evaluation completed Falls evaluation completed      MEDICARE RISK AT HOME: Medicare Risk at Home Any stairs in or around the home?: Yes If so, are there any without handrails?: Yes Home free of loose throw rugs in walkways, pet beds, electrical cords, etc?: Yes Adequate lighting in your home to reduce risk of falls?: Yes Life alert?: No Use of a cane, walker or w/c?: No Grab bars in the bathroom?: No Shower chair or bench in shower?: No Elevated toilet seat or a handicapped toilet?: No  TIMED UP AND GO:  Was the test performed?  No    Cognitive Function:        10/22/2022    8:16 AM  6CIT Screen  What Year? 0 points  What month? 0 points  What time? 0 points  Count back from 20 0 points  Months in reverse 2 points  Repeat phrase 4 points  Total Score 6 points    Immunizations Immunization History  Administered Date(s) Administered   Influenza Split 12/04/2010   Influenza,inj,Quad PF,6+  Mos 12/09/2015, 01/22/2017, 12/18/2020   Influenza-Unspecified 12/09/2015, 01/22/2017   PFIZER Comirnaty(Gray Top)Covid-19 Tri-Sucrose Vaccine 07/04/2020   PFIZER(Purple Top)SARS-COV-2 Vaccination 05/12/2019, 06/02/2019, 01/03/2020   Pfizer Covid-19 Vaccine Bivalent Booster 9yrs & up 01/15/2021   Tdap 10/09/2020   Unspecified SARS-COV-2 Vaccination 12/04/2021   Zoster Recombinant(Shingrix) 10/30/2020, 01/01/2021    TDAP status: Up to date  Flu Vaccine status: Due, Education has been  provided regarding the importance of this vaccine. Advised may receive this vaccine at local pharmacy or Health Dept. Aware to provide a copy of the vaccination record if obtained from local pharmacy or Health Dept. Verbalized acceptance and understanding.  Pneumococcal vaccine status: Due, Education has been provided regarding the importance of this vaccine. Advised may receive this vaccine at local pharmacy or Health Dept. Aware to provide a copy of the vaccination record if obtained from local pharmacy or Health Dept. Verbalized acceptance and understanding.  Covid-19 vaccine status: Completed vaccines  Qualifies for Shingles Vaccine? Yes   Zostavax completed Yes   Shingrix Completed?: Yes  Screening Tests Health Maintenance  Topic Date Due   COVID-19 Vaccine (7 - 2023-24 season) 01/29/2022   INFLUENZA VACCINE  09/30/2022   Pneumonia Vaccine 59+ Years old (1 of 1 - PCV) 10/30/2022 (Originally 07/16/2021)   Medicare Annual Wellness (AWV)  10/22/2023   DTaP/Tdap/Td (2 - Td or Tdap) 10/10/2030   Colonoscopy  08/28/2031   Hepatitis C Screening  Completed   Zoster Vaccines- Shingrix  Completed   HPV VACCINES  Aged Out    Health Maintenance  Health Maintenance Due  Topic Date Due   COVID-19 Vaccine (7 - 2023-24 season) 01/29/2022   INFLUENZA VACCINE  09/30/2022    Colorectal cancer screening: Type of screening: Colonoscopy. Completed 08/27/2021. Repeat every 10 years  Lung Cancer Screening: (Low Dose CT Chest recommended if Age 27-80 years, 20 pack-year currently smoking OR have quit w/in 15years.) does not qualify.   Lung Cancer Screening Referral: no  Additional Screening:  Hepatitis C Screening: does qualify; Completed 08/15/2019  Vision Screening: Recommended annual ophthalmology exams for early detection of glaucoma and other disorders of the eye. Is the patient up to date with their annual eye exam?  Yes  Who is the provider or what is the name of the office in which the  patient attends annual eye exams? [facility] If pt is not established with a provider, would they like to be referred to a provider to establish care? No .   Dental Screening: Recommended annual dental exams for proper oral hygiene  Diabetic Foot Exam: n/a  Community Resource Referral / Chronic Care Management: CRR required this visit?  No   CCM required this visit?  No     Plan:     I have personally reviewed and noted the following in the patient's chart:   Medical and social history Use of alcohol, tobacco or illicit drugs  Current medications and supplements including opioid prescriptions. Patient is not currently taking opioid prescriptions. Functional ability and status Nutritional status Physical activity Advanced directives List of other physicians Hospitalizations, surgeries, and ER visits in previous 12 months Vitals Screenings to include cognitive, depression, and falls Referrals and appointments  In addition, I have reviewed and discussed with patient certain preventive protocols, quality metrics, and best practice recommendations. A written personalized care plan for preventive services as well as general preventive health recommendations were provided to patient.     Barb Merino, LPN   [id number]/61/0960   After Visit Summary: (MyChart)  Due to this being a telephonic visit, the after visit summary with patients personalized plan was offered to patient via MyChart   Nurse Notes: none

## 2022-10-25 ENCOUNTER — Other Ambulatory Visit: Payer: Self-pay | Admitting: Medical

## 2022-11-04 ENCOUNTER — Telehealth: Payer: Self-pay | Admitting: Medical

## 2022-11-04 ENCOUNTER — Encounter: Payer: Self-pay | Admitting: Medical

## 2022-11-04 ENCOUNTER — Other Ambulatory Visit: Payer: Self-pay | Admitting: Medical

## 2022-11-04 ENCOUNTER — Ambulatory Visit (INDEPENDENT_AMBULATORY_CARE_PROVIDER_SITE_OTHER): Payer: Medicare HMO | Admitting: Medical

## 2022-11-04 VITALS — BP 120/80 | HR 77 | Ht 68.5 in | Wt 246.4 lb

## 2022-11-04 DIAGNOSIS — I10 Essential (primary) hypertension: Secondary | ICD-10-CM | POA: Diagnosis not present

## 2022-11-04 DIAGNOSIS — Z6837 Body mass index (BMI) 37.0-37.9, adult: Secondary | ICD-10-CM | POA: Diagnosis not present

## 2022-11-04 DIAGNOSIS — Z0000 Encounter for general adult medical examination without abnormal findings: Secondary | ICD-10-CM

## 2022-11-04 DIAGNOSIS — R7301 Impaired fasting glucose: Secondary | ICD-10-CM

## 2022-11-04 DIAGNOSIS — Z96642 Presence of left artificial hip joint: Secondary | ICD-10-CM

## 2022-11-04 DIAGNOSIS — Z7185 Encounter for immunization safety counseling: Secondary | ICD-10-CM | POA: Diagnosis not present

## 2022-11-04 DIAGNOSIS — L853 Xerosis cutis: Secondary | ICD-10-CM

## 2022-11-04 DIAGNOSIS — E782 Mixed hyperlipidemia: Secondary | ICD-10-CM | POA: Diagnosis not present

## 2022-11-04 DIAGNOSIS — Z8546 Personal history of malignant neoplasm of prostate: Secondary | ICD-10-CM

## 2022-11-04 DIAGNOSIS — Z131 Encounter for screening for diabetes mellitus: Secondary | ICD-10-CM | POA: Diagnosis not present

## 2022-11-04 LAB — CBC
Hematocrit: 47.1 % (ref 37.5–51.0)
Hemoglobin: 15.4 g/dL (ref 13.0–17.7)
MCH: 30.2 pg (ref 26.6–33.0)
MCHC: 32.7 g/dL (ref 31.5–35.7)
MCV: 92 fL (ref 79–97)
Platelets: 203 10*3/uL (ref 150–450)
RBC: 5.1 x10E6/uL (ref 4.14–5.80)
RDW: 13.3 % (ref 11.6–15.4)
WBC: 7.2 10*3/uL (ref 3.4–10.8)

## 2022-11-04 LAB — POCT URINALYSIS DIP (PROADVANTAGE DEVICE)
Bilirubin, UA: NEGATIVE
Blood, UA: NEGATIVE
Glucose, UA: NEGATIVE mg/dL
Ketones, POC UA: NEGATIVE mg/dL
Nitrite, UA: NEGATIVE
Specific Gravity, Urine: 1.025
Urobilinogen, Ur: NEGATIVE
pH, UA: 6 (ref 5.0–8.0)

## 2022-11-04 LAB — BASIC METABOLIC PANEL
BUN/Creatinine Ratio: 9 — ABNORMAL LOW (ref 10–24)
BUN: 11 mg/dL (ref 8–27)
CO2: 22 mmol/L (ref 20–29)
Calcium: 9.5 mg/dL (ref 8.6–10.2)
Chloride: 105 mmol/L (ref 96–106)
Creatinine, Ser: 1.22 mg/dL (ref 0.76–1.27)
Glucose: 111 mg/dL — ABNORMAL HIGH (ref 70–99)
Potassium: 4.3 mmol/L (ref 3.5–5.2)
Sodium: 142 mmol/L (ref 134–144)
eGFR: 65 mL/min/{1.73_m2} (ref >59)

## 2022-11-04 LAB — HEMOGLOBIN A1C
Est. average glucose Bld gHb Est-mCnc: 143 mg/dL
Hgb A1c MFr Bld: 6.6 % — ABNORMAL HIGH (ref 4.8–5.6)

## 2022-11-04 MED ORDER — LOSARTAN POTASSIUM 50 MG PO TABS: 50.0000 mg | ORAL_TABLET | Freq: Every day | ORAL | Status: DC

## 2022-11-04 MED ORDER — FENOFIBRATE 145 MG PO TABS: 145.0000 mg | ORAL_TABLET | Freq: Every day | ORAL | 3 refills | Status: DC

## 2022-11-04 NOTE — Telephone Encounter (Signed)
Pt called and said the medication does cost him for fenofibrate so he is wondering if he can go back to other medication and take 2 in morning and 2 at night or whatever you think is best but he says he does not want to pay for the medicine.

## 2022-11-04 NOTE — Progress Notes (Signed)
Subjective:    Mark Valentine is a 67 y.o. male who presents for well visit.  fasting  Primary Care Provider Dyan Labarbera, Kermit Balo, PA-C here for primary care  Medical care team includes: Dr. Amie Critchley, dentist Eye doctor Dr. Glenford Peers Urology, Dr. Gaynelle Arabian, [facility] Dr. Durene Romans, orthopedist Dr. Carolan Clines, cardiology Dr. Hinton Rao, podiatry Dr. Nita Sells, dermatology Dr. Tiajuana Amass, GI  Exercise Current exercise habits: not much in last few weeks, but tries to get exercise regularly  Nutrition/Diet Current diet: trying to eat healthy   Depression Screen    10/22/2022    8:15 AM  Depression screen PHQ 2/9  Decreased Interest 0  Down, Depressed, Hopeless 0  PHQ - 2 Score 0  Altered sleeping 0  Tired, decreased energy 0  Change in appetite 0  Feeling bad or failure about yourself  0  Trouble concentrating 0  Moving slowly or fidgety/restless 0  Suicidal thoughts 0  PHQ-9 Score 0  Difficult doing work/chores Not difficult at all    Fall Risk Screen    10/22/2022    7:51 AM 10/29/2021    9:13 AM 10/03/2020   10:18 AM 08/15/2019    1:54 PM 02/23/2017    8:16 AM  Fall Risk   Falls in the past year? 0 0 0 1 No  Number falls in past yr: 0 0 0 0   Injury with Fall? 0 0 0 1   Risk for fall due to : Medication side effect No Fall Risks No Fall Risks    Follow up Falls prevention discussed;Falls evaluation completed Falls evaluation completed Falls evaluation completed       Past Medical History:  Diagnosis Date   Anal fissure 1980   Aortic atherosclerosis (HCC) 2023   Coronary artery calcification seen on CT scan 2023   History of testicular cancer    Hyperlipidemia    Hypertension 09/24/2015   Lipoprotein deficiencies    Low HDL (under 40)    Osteoarthritis of hip    Prostate cancer (HCC) 2017   robotic prostatectomy 06-04-15   Wears glasses     Past Surgical History:  Procedure Laterality Date   anal fistula repair  1980's    COLONOSCOPY  02/01/2011   normal, repeat 2022; Dr. Jarold Motto   COLONOSCOPY  2023   HIP CLOSED REDUCTION Left 11/11/2015   Procedure: CLOSED REDUCTION HIP;  Surgeon: Durene Romans, MD;  Location: [facility];  Service: Orthopedics;  Laterality: Left;   ORCHIECTOMY  1997   prostate cancer- one testicle only   PROSTATE BIOPSY  2010   Dr. Logan Bores, Urology   PROSTATECTOMY  05/2015   robotic   TOTAL HIP ARTHROPLASTY Right 09/30/2015   Procedure: RIGHT TOTAL HIP ARTHROPLASTY ANTERIOR APPROACH;  Surgeon: Durene Romans, MD;  Location: [facility];  Service: Orthopedics;  Laterality: Right;   TOTAL HIP ARTHROPLASTY Left 11/11/2015   Procedure: LEFT TOTAL HIP ARTHROPLASTY ANTERIOR APPROACH;  Surgeon: Durene Romans, MD;  Location: [facility];  Service: Orthopedics;  Laterality: Left;    Social History   Socioeconomic History   Marital status: Married    Spouse name: Not on file   Number of children: 1   Years of education: Not on file   Highest education level: Not on file  Occupational History   Not on file  Tobacco Use   Smoking status: Never   Smokeless tobacco: Never  Vaping Use   Vaping status: Never Used  Substance and Sexual Activity  Alcohol use: No   Drug use: No   Sexual activity: Yes  Other Topics Concern   Not on file  Social History Narrative   Married, limited exercise, former judge.  Worked for [employer], prior worked at [employer] in [place].  Still working part time driving as flag car for deliveries.   First wife passed away December 03, 1999.  Has 1 daugthter.  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  Mother passed 2015/12/03.  12/03/22   Social Determinants of Health   Financial Resource Strain: Low Risk  (10/22/2022)   Overall Financial Resource Strain (CARDIA)    Difficulty of Paying Living Expenses: Not hard at all  Food Insecurity: No Food Insecurity (10/22/2022)   Hunger Vital Sign    Worried About Running Out of Food in the Last Year: Never true    Ran Out of Food in the Last Year: Never true   Transportation Needs: No Transportation Needs (10/22/2022)   PRAPARE - Administrator, Civil Service (Medical): No    Lack of Transportation (Non-Medical): No  Physical Activity: Sufficiently Active (10/22/2022)   Exercise Vital Sign    Days of Exercise per Week: 5 days    Minutes of Exercise per Session: 30 min  Stress: No Stress Concern Present (10/22/2022)   [employer] of Occupational Health - Occupational Stress Questionnaire    Feeling of Stress : Not at all  Social Connections: Unknown (10/22/2022)   Social Connection and Isolation Panel [NHANES]    Frequency of Communication with Friends and Family: More than three times a week    Frequency of Social Gatherings with Friends and Family: Once a week    Attends Religious Services: Not on Insurance claims handler of Clubs or Organizations: Yes    Attends Banker Meetings: More than 4 times per year    Marital Status: Married  Catering manager Violence: Not At Risk (10/22/2022)   Humiliation, Afraid, Rape, and Kick questionnaire    Fear of Current or Ex-Partner: No    Emotionally Abused: No    Physically Abused: No    Sexually Abused: No    Family History  Problem Relation Age of Onset   Arthritis Mother    Diabetes Mother    Stroke Mother    Hypertension Mother    Heart disease Father        aortic disease, possible dissection   Hypertension Sister    Mental illness Sister    Diabetes Sister    Hypertension Brother    Colon cancer Neg Hx    Colon polyps Neg Hx    Esophageal cancer Neg Hx    Stomach cancer Neg Hx    Rectal cancer Neg Hx      Current Outpatient Medications:    aspirin EC 81 MG tablet, Take 1 tablet (81 mg total) by mouth daily. Swallow whole., Disp: 30 tablet, Rfl: 12   fenofibrate (TRICOR) 145 MG tablet, Take 1 tablet (145 mg total) by mouth daily., Disp: 90 tablet, Rfl: 3   rosuvastatin (CRESTOR) 40 MG tablet, Take 1 tablet (40 mg total) by mouth daily., Disp: 90 tablet,  Rfl: 3   losartan (COZAAR) 50 MG tablet, Take 1 tablet (50 mg total) by mouth daily., Disp: , Rfl:   Allergies  Allergen Reactions   Mobic [Meloxicam] Other (See Comments)    bleeding    History reviewed: allergies, current medications, past family history, past medical history, past social history, past surgical history and problem  list  Chronic issues discussed: Hypertension-compliant with medication  Hyperlipidemia-compliant with medication without complaint  History of prostate cancer.   Sees Urology, Dr. Earlene Plater.  S/p robotic prostatectomy in April 2017.  History of testicular cancer and prior radiation therapy    Objective:      Biometrics BP 120/80   Pulse 77   Ht 5' 8.5" (1.74 m)   Wt 246 lb 6.4 oz (111.8 kg)   BMI 36.92 kg/m   Wt Readings from Last 3 Encounters:  11/04/22 246 lb 6.4 oz (111.8 kg)  03/31/22 249 lb (112.9 kg)  03/20/22 242 lb (109.8 kg)   Gen: wd, wn ,nad Skin: dry skin of both legs, some dry plaques, no worrisome lesions HEENT: normocephalic, sclerae anicteric, TMs pearly, nares patent, no discharge or erythema, pharynx normal Oral cavity: MMM, no lesions Neck: supple, no lymphadenopathy, no thyromegaly, no masses, no bruits Heart: RRR, normal S1, S2, no murmurs Lungs: CTA bilaterally, no wheezes, rhonchi, or rales Abdomen: +bs, soft, non tender, non distended, no masses, no hepatomegaly, no splenomegaly Musculoskeletal: nontender, no swelling, no obvious deformity Extremities: no edema, no cyanosis, no clubbing Pulses: 2+ symmetric, upper and lower extremities, normal cap refill Neurological: alert, oriented x 3, CN2-12 intact, strength normal upper extremities and lower extremities, sensation normal throughout, DTRs 2+ throughout, no cerebellar signs, gait normal Psychiatric: normal affect, behavior normal, pleasant  GU/rectal -deferred to urology    Assessment:   Encounter Diagnoses  Name Primary?   Encounter for health maintenance  examination in adult Yes   BMI 37.0-37.9, adult    Essential hypertension    Impaired fasting blood sugar    Mixed dyslipidemia    Status post left hip replacement    Vaccine counseling    History of prostate cancer    Screening for diabetes mellitus    Dry skin       Plan:    This visit was a preventative care visit, also known as wellness visit or routine physical.   Topics typically include healthy lifestyle, diet, exercise, preventative care, vaccinations, sick and well care, proper use of emergency dept and after hours care, as well as other concerns.     Recommendations: Continue to return yearly for your annual wellness and preventative care visits.  This gives [place] a chance to discuss healthy lifestyle, exercise, vaccinations, review your chart record, and perform screenings where appropriate.  I recommend you see your eye doctor yearly for routine vision care.  I recommend you see your dentist yearly for routine dental care including hygiene visits twice yearly.   Vaccination recommendations were reviewed Immunization History  Administered Date(s) Administered   Influenza Split 12/04/2010   Influenza,inj,Quad PF,6+ Mos 12/09/2015, 01/22/2017, 12/18/2020   Influenza-Unspecified 12/09/2015, 01/22/2017   PFIZER Comirnaty(Gray Top)Covid-19 Tri-Sucrose Vaccine 07/04/2020   PFIZER(Purple Top)SARS-COV-2 Vaccination 05/12/2019, 06/02/2019, 01/03/2020   Pfizer Covid-19 Vaccine Bivalent Booster 54yrs & up 01/15/2021   Tdap 10/09/2020   Unspecified SARS-COV-2 Vaccination 12/04/2021   Zoster Recombinant(Shingrix) 10/30/2020, 01/01/2021    Consider prevnar 20, but he declines today  Declines flu shot until October.    Screening for cancer: Colon cancer screening: I reviewed your colonoscopy on file that is up to date from 07/2021  We discussed PSA, prostate exam, and prostate cancer screening risks/benefits.     Skin cancer screening: Check your skin regularly for new  changes, growing lesions, or other lesions of concern Come in for evaluation if you have skin lesions of concern.  Lung cancer screening: If  you have a greater than 20 pack year history of tobacco use, then you may qualify for lung cancer screening with a chest CT scan.   Please call your insurance company to inquire about coverage for this test.  We currently don't have screenings for other cancers besides breast, cervical, colon, and lung cancers.  If you have a strong family history of cancer or have other cancer screening concerns, please let me know.    Bone health: Get at least 150 minutes of aerobic exercise weekly Get weight bearing exercise at least once weekly Bone density test:  A bone density test is an imaging test that uses a type of X-ray to measure the amount of calcium and other minerals in your bones. The test may be used to diagnose or screen you for a condition that causes weak or thin bones (osteoporosis), predict your risk for a broken bone (fracture), or determine how well your osteoporosis treatment is working. The bone density test is recommended for females 65 and older, or females or males <65 if certain risk factors such as thyroid disease, long term use of steroids such as for asthma or rheumatological issues, vitamin D deficiency, estrogen deficiency, family history of osteoporosis, self or family history of fragility fracture in first degree relative.    Heart health: Get at least 150 minutes of aerobic exercise weekly Limit alcohol It is important to maintain a healthy blood pressure and healthy cholesterol numbers  Heart disease screening: Screening for heart disease includes screening for blood pressure, fasting lipids, glucose/diabetes screening, BMI height to weight ratio, reviewed of smoking status, physical activity, and diet.    Goals include blood pressure 120/80 or less, maintaining a healthy lipid/cholesterol profile, preventing diabetes or keeping  diabetes numbers under good control, not smoking or using tobacco products, exercising most days per week or at least 150 minutes per week of exercise, and eating healthy variety of fruits and vegetables, healthy oils, and avoiding unhealthy food choices like fried food, fast food, high sugar and high cholesterol foods.    Other tests may possibly include EKG test, CT coronary calcium score, echocardiogram, exercise treadmill stress test.   04/2022 CT coronary test  IMPRESSION: 1. 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   2. Coronary calcium score of 405. This was 80th percentile for age and sex matched control.   3. Normal coronary origin with right dominance.   4.  Aortic atherosclerosis.   INTERPRETATION:   CAD-RADS 2: Mild non-obstructive CAD (25-49%). Consider non-atherosclerotic causes of chest pain. Consider preventive therapy and risk factor modification.     Medical care options: I recommend you continue to seek care here first for routine care.  We try really hard to have available appointments Monday through Friday daytime hours for sick visits, acute visits, and physicals.  Urgent care should be used for after hours and weekends for significant issues that cannot wait till the next day.  The emergency department should be used for significant potentially life-threatening emergencies.  The emergency department is expensive, can often have long wait times for less significant concerns, so try to utilize primary care, urgent care, or telemedicine when possible to avoid unnecessary trips to the emergency department.  Virtual visits and telemedicine have been introduced since the pandemic started in 2020, and can be convenient ways to receive medical care.  We offer virtual appointments as well to assist you in a variety of options to seek medical care.    Separate significant issues discussed: Blood pressures -  continue current medication, Losartan 50mg  daily  High cholesterol-doing  fine on Crestor 40mg .  Reviewed recent labs in 06/2022.  Change from gemfibrozil to fenofibrate to see if this works better for HDL.  Gemfibrozil helped his triglycerides but not the HDL  Impaired glucose, prediabetes-updated labs today  History of prostate cancer in remission-sees urology yearly  Dry skin-advised to use lotion daily  Mark Valentine was seen today for annual exam.  Diagnoses and all orders for this visit:  Encounter for health maintenance examination in adult -     Basic metabolic panel -     CBC -     Hemoglobin A1c -     POCT Urinalysis DIP (Proadvantage Device)  BMI 37.0-37.9, adult  Essential hypertension  Impaired fasting blood sugar  Mixed dyslipidemia  Status post left hip replacement  Vaccine counseling  History of prostate cancer  Screening for diabetes mellitus -     Hemoglobin A1c  Dry skin  Other orders -     losartan (COZAAR) 50 MG tablet; Take 1 tablet (50 mg total) by mouth daily. -     fenofibrate (TRICOR) 145 MG tablet; Take 1 tablet (145 mg total) by mouth daily.    F/u pending labs

## 2022-11-05 ENCOUNTER — Other Ambulatory Visit: Payer: Self-pay | Admitting: Medical

## 2022-11-05 DIAGNOSIS — E782 Mixed hyperlipidemia: Secondary | ICD-10-CM

## 2022-11-05 DIAGNOSIS — Z79899 Other long term (current) drug therapy: Secondary | ICD-10-CM

## 2022-11-05 MED ORDER — ROSUVASTATIN CALCIUM 40 MG PO TABS: 40.0000 mg | ORAL_TABLET | Freq: Every day | ORAL | 3 refills | Status: DC

## 2022-11-05 MED ORDER — LOSARTAN POTASSIUM 50 MG PO TABS: 50.0000 mg | ORAL_TABLET | Freq: Every day | ORAL | 3 refills | Status: DC

## 2022-11-05 MED ORDER — ASPIRIN 81 MG PO TBEC: 81.0000 mg | DELAYED_RELEASE_TABLET | Freq: Every day | ORAL | 3 refills | Status: DC

## 2022-11-05 NOTE — Progress Notes (Signed)
Results sent through MyChart

## 2022-11-05 NOTE — Telephone Encounter (Signed)
Patient called back about new rx, the price was for 90 day supply and he did not realize that He is going to get 30 day supply for now and try it and will let you know how it works

## 2022-11-12 ENCOUNTER — Telehealth: Payer: Self-pay | Admitting: Medical

## 2022-11-12 NOTE — Telephone Encounter (Signed)
Pt informed

## 2022-11-12 NOTE — Telephone Encounter (Signed)
Pt called re new rx Fenofibrate   He wants to know if this replaces the previous rx you gave

## 2022-11-18 ENCOUNTER — Other Ambulatory Visit: Payer: Self-pay | Admitting: Medical

## 2022-12-03 DIAGNOSIS — M792 Neuralgia and neuritis, unspecified: Secondary | ICD-10-CM | POA: Diagnosis not present

## 2022-12-03 DIAGNOSIS — M7731 Calcaneal spur, right foot: Secondary | ICD-10-CM | POA: Diagnosis not present

## 2022-12-03 DIAGNOSIS — M7661 Achilles tendinitis, right leg: Secondary | ICD-10-CM | POA: Diagnosis not present

## 2022-12-03 DIAGNOSIS — M7751 Other enthesopathy of right foot: Secondary | ICD-10-CM | POA: Diagnosis not present

## 2022-12-30 DIAGNOSIS — H25013 Cortical age-related cataract, bilateral: Secondary | ICD-10-CM | POA: Diagnosis not present

## 2022-12-30 DIAGNOSIS — H524 Presbyopia: Secondary | ICD-10-CM | POA: Diagnosis not present

## 2023-01-20 DIAGNOSIS — H5213 Myopia, bilateral: Secondary | ICD-10-CM | POA: Diagnosis not present

## 2023-01-20 DIAGNOSIS — H524 Presbyopia: Secondary | ICD-10-CM | POA: Diagnosis not present

## 2023-04-27 DIAGNOSIS — Z96641 Presence of right artificial hip joint: Secondary | ICD-10-CM | POA: Diagnosis not present

## 2023-04-27 DIAGNOSIS — S76011A Strain of muscle, fascia and tendon of right hip, initial encounter: Secondary | ICD-10-CM | POA: Diagnosis not present

## 2023-05-02 ENCOUNTER — Other Ambulatory Visit: Payer: Self-pay | Admitting: Medical

## 2023-09-23 DIAGNOSIS — C61 Malignant neoplasm of prostate: Secondary | ICD-10-CM | POA: Diagnosis not present

## 2023-10-19 ENCOUNTER — Other Ambulatory Visit: Payer: Self-pay | Admitting: Medical

## 2023-11-11 ENCOUNTER — Encounter: Payer: Medicare HMO | Admitting: Medical

## 2023-12-16 ENCOUNTER — Other Ambulatory Visit: Payer: Self-pay | Admitting: Medical

## 2023-12-23 ENCOUNTER — Telehealth: Payer: Self-pay | Admitting: Medical

## 2023-12-23 DIAGNOSIS — Z79899 Other long term (current) drug therapy: Secondary | ICD-10-CM

## 2023-12-23 DIAGNOSIS — E782 Mixed hyperlipidemia: Secondary | ICD-10-CM

## 2023-12-23 MED ORDER — ROSUVASTATIN CALCIUM 40 MG PO TABS: 40.0000 mg | ORAL_TABLET | Freq: Every day | ORAL | 0 refills | Status: DC

## 2023-12-23 NOTE — Telephone Encounter (Signed)
 Copied from CRM [id number]. Topic: Clinical - Medication Refill >> Dec 23, 2023 11:06 AM Charlet HERO wrote: Medication: vastatin (CRESTOR ) 40 MG tablet  Has the patient contacted their pharmacy? Yes [facility] requesting med refill [phone] ext [phone]  This is the patient's preferred pharmacy:  [id number] - [place], [place] - [address] AT [facility] [address] [place] [address] Phone: [phone] Fax: [phone]  Is this the correct pharmacy for this prescription? Yes If no, delete pharmacy and type the correct one.   Has the prescription been filled recently? Yes  Is the patient out of the medication? Yes  Has the patient been seen for an appointment in the last year OR does the patient have an upcoming appointment? Yes  Can we respond through MyChart? No  Agent: Please be advised that Rx refills may take up to 3 business days. We ask that you follow-up with your pharmacy.

## 2023-12-25 ENCOUNTER — Other Ambulatory Visit: Payer: Self-pay | Admitting: Medical

## 2023-12-28 ENCOUNTER — Other Ambulatory Visit: Payer: Self-pay | Admitting: Medical

## 2023-12-28 DIAGNOSIS — Z79899 Other long term (current) drug therapy: Secondary | ICD-10-CM

## 2023-12-28 DIAGNOSIS — E782 Mixed hyperlipidemia: Secondary | ICD-10-CM

## 2023-12-28 NOTE — Telephone Encounter (Signed)
   This was already refilled recently

## 2024-01-17 ENCOUNTER — Ambulatory Visit: Payer: Self-pay

## 2024-02-20 ENCOUNTER — Ambulatory Visit: Admitting: Family Medicine

## 2024-02-20 VITALS — BP 134/84 | HR 80 | Temp 97.9°F | Ht 69.0 in | Wt 248.0 lb

## 2024-02-20 DIAGNOSIS — J069 Acute upper respiratory infection, unspecified: Secondary | ICD-10-CM

## 2024-02-20 MED ORDER — AMOXICILLIN 500 MG PO TABS: 1000.0000 mg | ORAL_TABLET | Freq: Two times a day (BID) | ORAL | 0 refills | Status: DC

## 2024-02-20 NOTE — Patient Instructions (Signed)
 Stay well hydrated. Continue the mucinex as needed to help keep the phlegm thin.  You may continue the DM version if that's all you have, but it doesn't sound like the cough is that bad. Plain mucinex would also work well.  Consider Neti-pot or Sinus Rinse kit, as we discussed (using distilled or boiled water , not tap water ) once or twice daily if needed for sinus congestion and pain.  Start the antibiotic only if you develop worsening symptoms--specifically discolored mucus or phlegm that persists throughout the day, or if you have fever, or persistent or worsening sinus pain.  Currently, it sounds as though it is a viral illness, that is running the typical course, and starting to improve.

## 2024-02-20 NOTE — Progress Notes (Signed)
 Chief Complaint  Patient presents with   Sinusitis    Sinus pain and pressure in his head x 10 days. Taking mucinex DM. No fevers, has been hot and cold off and on. No home testing done.     12/10 he started with a tickle in his throat, then developed into the crud--fatigue, URI symptoms. Never felt achey. He took off work the 11th and the 12th, but worked all of last week. Symptoms include nasal congestion and runny nose, head feels stopped up. He denies sinus pain. Once he noticed some blood in the mucus, but otherwise it has been yellow, clears up during the day. When he expectorates up phlegm, it is green, in the morning. He only has a slight cough. He has had some subjective mild fevers, but denies chills.  He started with plain mucinex.  Switched to the DM yesterday. He feels the best I've felt today, in the last few days. Head still feels congested, and throat is still a little hoarse.  +sick contacts at work.     PMH, PSH, SH reviewed  HTN, HLD, h/o prostate cancer  Outpatient Encounter Medications as of 02/20/2024  Medication Sig Note   aspirin  EC 81 MG tablet Take 1 tablet (81 mg total) by mouth daily. Swallow whole.    dextromethorphan-guaiFENesin (MUCINEX DM) 30-600 MG 12hr tablet Take 1 tablet by mouth 2 (two) times daily. 02/20/2024: Last dose 9am   fenofibrate  (TRICOR ) 145 MG tablet TAKE 1 TABLET BY MOUTH EVERY DAY    losartan  (COZAAR ) 50 MG tablet TAKE 1 TABLET BY MOUTH EVERY DAY    rosuvastatin  (CRESTOR ) 40 MG tablet Take 1 tablet (40 mg total) by mouth daily.    No facility-administered encounter medications on file as of 02/20/2024.   Allergies  Allergen Reactions   Mobic  [Meloxicam ] Other (See Comments)    bleeding    ROS: no f/c, no n/v/d, skin rashes, bleeding, bruising. No urinary complaints. No chest pain or shortness of breath. URI symptoms per HPI.     PHYSICAL EXAM:  BP 134/84   Pulse 80   Temp 97.9 F (36.6 C) (Tympanic)   Ht  5' 9 (1.753 m)   Wt 248 lb (112.5 kg)   BMI 36.62 kg/m   Pleasant, well-appearing male, in good spirits HEENT: conjunctiva and sclera are clear, EOMI.   Nasal mucosa is pretty clear, with some clear stringy mucous noted bilaterally.   OP is clear TM's and EAC's are normal bilaterally. Sinuses are nontender Neck: no lymphadenopathy Heart: regular rate and rhythm Lungs: clear bilaterally Neuro: alert and oriented, cranial nerves grossly intact, normal gait Psych: normal mood, affect, hygiene and grooming    ASSESSMENT/PLAN:  Viral upper respiratory illness - Improving, running typical viral course.  S/sx bacterial infection reviewed. To start Amoxil if purulent drainage, worsening sx in next week  Amoxil sent to pharmacy--to start only if s/sx bacterial infection develop. Supportive measures reviewed.  I spent 24 minutes dedicated to the care of this patient, including pre-visit review of records, face to face time, post-visit ordering of testing and documentation.   Stay well hydrated. Continue the mucinex as needed to help keep the phlegm thin.  You may continue the DM version if that's all you have, but it doesn't sound like the cough is that bad. Plain mucinex would also work well.  Consider Neti-pot or Sinus Rinse kit, as we discussed (using distilled or boiled water , not tap water ) once or twice daily if needed for sinus  congestion and pain.  Start the antibiotic only if you develop worsening symptoms--specifically discolored mucus or phlegm that persists throughout the day, or if you have fever, or persistent or worsening sinus pain.  Currently, it sounds as though it is a viral illness, that is running the typical course, and starting to improve.

## 2024-03-09 ENCOUNTER — Encounter: Admitting: Medical

## 2024-03-10 ENCOUNTER — Other Ambulatory Visit: Payer: Self-pay | Admitting: Medical

## 2024-03-12 ENCOUNTER — Encounter: Payer: Self-pay | Admitting: Medical

## 2024-03-12 ENCOUNTER — Ambulatory Visit (INDEPENDENT_AMBULATORY_CARE_PROVIDER_SITE_OTHER): Payer: Self-pay | Admitting: Medical

## 2024-03-12 VITALS — BP 124/78 | HR 80 | Ht 68.0 in | Wt 246.4 lb

## 2024-03-12 DIAGNOSIS — I10 Essential (primary) hypertension: Secondary | ICD-10-CM

## 2024-03-12 DIAGNOSIS — E782 Mixed hyperlipidemia: Secondary | ICD-10-CM | POA: Diagnosis not present

## 2024-03-12 DIAGNOSIS — Z0000 Encounter for general adult medical examination without abnormal findings: Secondary | ICD-10-CM

## 2024-03-12 DIAGNOSIS — Z8546 Personal history of malignant neoplasm of prostate: Secondary | ICD-10-CM | POA: Diagnosis not present

## 2024-03-12 DIAGNOSIS — R7301 Impaired fasting glucose: Secondary | ICD-10-CM

## 2024-03-12 DIAGNOSIS — Z7185 Encounter for immunization safety counseling: Secondary | ICD-10-CM

## 2024-03-12 LAB — LIPID PANEL

## 2024-03-12 NOTE — Progress Notes (Signed)
 "  Name: Mark Valentine   Date of Visit: 03/12/2024   Date of last visit with me: 03/10/2024   CHIEF COMPLAINT:  Chief Complaint  Patient presents with   Annual Exam    CPE,        HPI:  Discussed the use of AI scribe software for clinical note transcription with the patient, who gave verbal consent to proceed.  History of Present Illness   Mark Valentine is a 69 year old male who presents for a wellness visit.  He has a history of prostate cancer with stable PSA levels. He experiences urinary incontinence when sneezing, which he attributes to his prostate cancer treatment history, including surgery and radiation. He is under the care of Dr. Tanda Moats for prostate cancer follow-up and has not required a digital rectal exam recently due to stable PSA levels.  He is currently taking aspirin  81 mg daily, fenofibrate  145 mg daily, Crestor  40 mg daily for cholesterol, and losartan  50 mg for blood pressure. He is allergic to meloxicam .  He had a colonoscopy in 2023, which was normal. He had a CT coronary heart screen in 2024, which showed some cholesterol buildup, and an EKG in 2024 at cardiology.  He reports a history of severe reactions to pneumonia and flu vaccines, stating he 'almost died' after receiving them, and therefore avoids these vaccinations.  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 He avoids crowded places like gyms due to discomfort with social interactions. He plans to travel more, with a particular interest in visiting the [place] , and mentions a desire to fish more.  Allergies[1]  Past Medical History:  Diagnosis Date   Anal fissure 1980   Aortic atherosclerosis 2023   Coronary artery calcification seen on CT scan 2023   History of testicular cancer    Hyperlipidemia    Hypertension 09/24/2015   Lipoprotein deficiencies    Low  HDL (under 40)    Osteoarthritis of hip    Prostate cancer (HCC) 2017   robotic prostatectomy 06-04-15   Wears glasses     Medications Ordered Prior to Encounter[2]   Current Medications[3]  Family History  Problem Relation Age of Onset   Arthritis Mother    Diabetes Mother    Stroke Mother    Hypertension Mother    Heart disease Father        aortic disease, possible dissection   Hypertension Sister    Mental illness Sister    Diabetes Sister    Hypertension Brother    Colon cancer Neg Hx    Colon polyps Neg Hx    Esophageal cancer Neg Hx    Stomach cancer Neg Hx    Rectal cancer Neg Hx     Past Surgical History:  Procedure Laterality Date   anal fistula repair  1980's   COLONOSCOPY  02/01/2011   normal, repeat 2022; Dr. Jakie   COLONOSCOPY  2023   HIP CLOSED REDUCTION Left 11/11/2015   Procedure: CLOSED REDUCTION HIP;  Surgeon: Donnice Car, MD;  Location: [facility];  Service: Orthopedics;  Laterality: Left;   ORCHIECTOMY  1997   prostate cancer- one testicle only   PROSTATE BIOPSY  2010   Dr. Janit, Urology   PROSTATECTOMY  05/2015   robotic   TOTAL HIP ARTHROPLASTY Right 09/30/2015   Procedure: RIGHT TOTAL HIP ARTHROPLASTY ANTERIOR APPROACH;  Surgeon:  Donnice Car, MD;  Location: [facility];  Service: Orthopedics;  Laterality: Right;   TOTAL HIP ARTHROPLASTY Left 11/11/2015   Procedure: LEFT TOTAL HIP ARTHROPLASTY ANTERIOR APPROACH;  Surgeon: Donnice Car, MD;  Location: [facility];  Service: Orthopedics;  Laterality: Left;    Review of Systems  Constitutional:  Negative for chills, fever, malaise/fatigue and weight loss.  HENT:  Negative for congestion, ear pain, hearing loss, sore throat and tinnitus.   Eyes:  Negative for blurred vision, pain and redness.  Respiratory:  Negative for cough, hemoptysis and shortness of breath.   Cardiovascular:  Negative for chest pain, palpitations, orthopnea, claudication and leg swelling.  Gastrointestinal:  Negative for  abdominal pain, blood in stool, constipation, diarrhea, nausea and vomiting.  Genitourinary:  Negative for dysuria, flank pain, frequency, hematuria and urgency.  Musculoskeletal:  Negative for falls, joint pain and myalgias.  Skin:  Negative for itching and rash.  Neurological:  Negative for dizziness, tingling, speech change, weakness and headaches.  Endo/Heme/Allergies:  Negative for polydipsia. Does not bruise/bleed easily.  Psychiatric/Behavioral:  Negative for depression and memory loss. The patient is not nervous/anxious and does not have insomnia.       Objective: BP 124/78   Pulse 80   Ht 5' 8 (1.727 m)   Wt 246 lb 6.4 oz (111.8 kg)   BMI 37.46 kg/m   Wt Readings from Last 3 Encounters:  03/12/24 246 lb 6.4 oz (111.8 kg)  02/20/24 248 lb (112.5 kg)  11/04/22 246 lb 6.4 oz (111.8 kg)   General appearence: alert, no distress, WD/WN Skin: Scattered skin tags, in the middle of his lower part of the chest is a raised somewhat pedunculated skin lesion possible verruca versus skin tag approximately 3 mm diameter by 3 to 4 mm length, few cherry hemangiomas of the torso, no worrisome lesions in general otherwise HEENT: normocephalic, sclerae anicteric, PERRLA, EOMi, nares patent, no discharge or erythema, pharynx normal Oral cavity: MMM, no lesions Neck: supple, no lymphadenopathy, no thyromegaly, no masses, no bruits Heart: RRR, normal S1, S2, no murmurs Lungs: CTA bilaterally, no wheezes, rhonchi, or rales Abdomen: +bs, soft, non tender, non distended, no masses, no hepatomegaly, no splenomegaly Back: non tender Musculoskeletal: nontender, no swelling, no obvious deformity Extremities: no edema, no cyanosis, no clubbing Pulses: 2+ symmetric, upper and lower extremities, normal cap refill Neurological: alert, oriented x 3, CN2-12 intact, strength normal upper extremities and lower extremities, sensation normal throughout, DTRs 2+ throughout, no cerebellar signs, gait  normal Psychiatric: normal affect, behavior normal, pleasant   GU/rectal - deferred/declined    Assessment: Encounter Diagnoses  Name Primary?   Encounter for health maintenance examination in adult Yes   Essential hypertension    History of prostate cancer    Impaired fasting blood sugar    Mixed dyslipidemia    Vaccine counseling      Plan:  Adult Wellness Visit Routine wellness visit with well-controlled blood pressure. Engages in regular walking but lacks weight-bearing exercise. Declined pneumonia and flu vaccines due to past adverse reactions. - Ordered routine labs including diabetes marker, liver, kidney, blood count, cholesterol, and urine tests. - Documented allergy to pneumonia and flu vaccines. -up to date colonoscopy 07/2021, due repeat in 10 years  Essential hypertension Blood pressure is well-controlled with current medication regimen. -continue Losartan  50mg  daily  Mixed dyslipidemia Managed with fenofibrate  145mg  daily and Crestor  40mg  daily. Continue aspirin  81mg  daily.   -routine labs today -CT coronary test 04/08/22 with calcium  score of 405, aortic  atherosclerosis noted.  History of prostate cancer -managed by urology - Continue annual PSA monitoring.   Mark Valentine was seen today for annual exam.  Diagnoses and all orders for this visit:  Encounter for health maintenance examination in adult -     Comprehensive metabolic panel with GFR -     CBC -     Lipid panel -     TSH -     Hemoglobin A1c -     Urinalysis, Routine w reflex microscopic  Essential hypertension -     Urinalysis, Routine w reflex microscopic  History of prostate cancer  Impaired fasting blood sugar -     Hemoglobin A1c  Mixed dyslipidemia -     Lipid panel  Vaccine counseling    F/u pending labs         [1]  Allergies Allergen Reactions   Influenza Vac Split Quad     Makes him sick, declines pneumococcal as well   Mobic  [Meloxicam ] Other (See Comments)     bleeding  [2]  Current Outpatient Medications on File Prior to Visit  Medication Sig Dispense Refill   aspirin  EC 81 MG tablet Take 1 tablet (81 mg total) by mouth daily. Swallow whole. 90 tablet 3   fenofibrate  (TRICOR ) 145 MG tablet TAKE 1 TABLET BY MOUTH EVERY DAY 30 tablet 2   losartan  (COZAAR ) 50 MG tablet TAKE 1 TABLET BY MOUTH EVERY DAY 30 tablet 0   rosuvastatin  (CRESTOR ) 40 MG tablet Take 1 tablet (40 mg total) by mouth daily. 90 tablet 0   amoxicillin  (AMOXIL ) 500 MG tablet Take 2 tablets (1,000 mg total) by mouth 2 (two) times daily. (Patient not taking: Reported on 03/12/2024) 40 tablet 0   dextromethorphan-guaiFENesin (MUCINEX DM) 30-600 MG 12hr tablet Take 1 tablet by mouth 2 (two) times daily. (Patient not taking: Reported on 03/12/2024)     No current facility-administered medications on file prior to visit.  [3]  Current Outpatient Medications:    aspirin  EC 81 MG tablet, Take 1 tablet (81 mg total) by mouth daily. Swallow whole., Disp: 90 tablet, Rfl: 3   fenofibrate  (TRICOR ) 145 MG tablet, TAKE 1 TABLET BY MOUTH EVERY DAY, Disp: 30 tablet, Rfl: 2   losartan  (COZAAR ) 50 MG tablet, TAKE 1 TABLET BY MOUTH EVERY DAY, Disp: 30 tablet, Rfl: 0   rosuvastatin  (CRESTOR ) 40 MG tablet, Take 1 tablet (40 mg total) by mouth daily., Disp: 90 tablet, Rfl: 0   amoxicillin  (AMOXIL ) 500 MG tablet, Take 2 tablets (1,000 mg total) by mouth 2 (two) times daily. (Patient not taking: Reported on 03/12/2024), Disp: 40 tablet, Rfl: 0   dextromethorphan-guaiFENesin (MUCINEX DM) 30-600 MG 12hr tablet, Take 1 tablet by mouth 2 (two) times daily. (Patient not taking: Reported on 03/12/2024), Disp: , Rfl:   "

## 2024-03-13 ENCOUNTER — Other Ambulatory Visit: Payer: Self-pay | Admitting: Medical

## 2024-03-13 ENCOUNTER — Ambulatory Visit: Payer: Self-pay | Admitting: Medical

## 2024-03-13 DIAGNOSIS — E782 Mixed hyperlipidemia: Secondary | ICD-10-CM

## 2024-03-13 DIAGNOSIS — Z79899 Other long term (current) drug therapy: Secondary | ICD-10-CM

## 2024-03-13 LAB — COMPREHENSIVE METABOLIC PANEL WITH GFR
ALT: 39 IU/L (ref 0–44)
AST: 37 IU/L (ref 0–40)
Albumin: 4.3 g/dL (ref 3.9–4.9)
Alkaline Phosphatase: 43 IU/L — AB (ref 47–123)
BUN/Creatinine Ratio: 12 (ref 10–24)
BUN: 17 mg/dL (ref 8–27)
Bilirubin Total: 0.4 mg/dL (ref 0.0–1.2)
CO2: 18 mmol/L — AB (ref 20–29)
Calcium: 9.5 mg/dL (ref 8.6–10.2)
Chloride: 109 mmol/L — AB (ref 96–106)
Creatinine, Ser: 1.37 mg/dL — AB (ref 0.76–1.27)
Globulin, Total: 2.5 g/dL (ref 1.5–4.5)
Glucose: 93 mg/dL (ref 70–99)
Potassium: 4.6 mmol/L (ref 3.5–5.2)
Sodium: 144 mmol/L (ref 134–144)
Total Protein: 6.8 g/dL (ref 6.0–8.5)
eGFR: 57 mL/min/1.73 — AB

## 2024-03-13 LAB — URINALYSIS, ROUTINE W REFLEX MICROSCOPIC
Urobilinogen, Ur: 1 mg/dL (ref 0.2–1.0)
pH, UA: 5.5 (ref 5.0–7.5)

## 2024-03-13 LAB — LIPID PANEL
Cholesterol, Total: 114 mg/dL (ref 100–199)
HDL: 23 mg/dL — AB
LDL CALC COMMENT:: 5 ratio (ref 0.0–5.0)
LDL Chol Calc (NIH): 58 mg/dL (ref 0–99)
Triglycerides: 195 mg/dL — AB (ref 0–149)
VLDL Cholesterol Cal: 33 mg/dL (ref 5–40)

## 2024-03-13 LAB — MICROSCOPIC EXAMINATION
Bacteria, UA: NONE SEEN
Casts: NONE SEEN /LPF
WBC, UA: NONE SEEN /HPF (ref 0–5)

## 2024-03-13 LAB — CBC
Hematocrit: 45.7 % (ref 37.5–51.0)
Hemoglobin: 14.2 g/dL (ref 13.0–17.7)
MCH: 30.2 pg (ref 26.6–33.0)
MCHC: 31.1 g/dL — ABNORMAL LOW (ref 31.5–35.7)
MCV: 97 fL (ref 79–97)
Platelets: 182 x10E3/uL (ref 150–450)
RBC: 4.7 x10E6/uL (ref 4.14–5.80)
RDW: 13.2 % (ref 11.6–15.4)
WBC: 7.2 x10E3/uL (ref 3.4–10.8)

## 2024-03-13 LAB — TSH: TSH: 2.69 u[IU]/mL (ref 0.450–4.500)

## 2024-03-13 LAB — HEMOGLOBIN A1C
Est. average glucose Bld gHb Est-mCnc: 131 mg/dL
Hgb A1c MFr Bld: 6.2 % — ABNORMAL HIGH (ref 4.8–5.6)

## 2024-03-13 MED ORDER — ROSUVASTATIN CALCIUM 40 MG PO TABS: 40.0000 mg | ORAL_TABLET | Freq: Every day | ORAL | 3 refills | Status: AC

## 2024-03-13 MED ORDER — ASPIRIN 81 MG PO TBEC: 81.0000 mg | DELAYED_RELEASE_TABLET | Freq: Every day | ORAL | 3 refills | Status: AC

## 2024-03-13 MED ORDER — LOSARTAN POTASSIUM 50 MG PO TABS: 50.0000 mg | ORAL_TABLET | Freq: Every day | ORAL | 3 refills | Status: AC

## 2024-03-13 NOTE — Progress Notes (Signed)
 Results through MyChart

## 2024-03-14 ENCOUNTER — Other Ambulatory Visit: Payer: Self-pay | Admitting: Medical

## 2024-03-14 ENCOUNTER — Telehealth: Payer: Self-pay | Admitting: Family Medicine

## 2024-03-14 MED ORDER — GEMFIBROZIL 600 MG PO TABS: 600.0000 mg | ORAL_TABLET | Freq: Two times a day (BID) | ORAL | 0 refills | Status: AC

## 2024-03-14 NOTE — Telephone Encounter (Signed)
 Pt was notified.

## 2024-03-14 NOTE — Telephone Encounter (Signed)
 Copied from CRM [id number]. Topic: Clinical - Medication Question >> Mar 14, 2024 12:55 PM Suzen RAMAN wrote: Reason for CRM: Patient is currently taking OTC Asprin and would like a call back for clarification on whether he needs to take both OTC and the one prescribed by the doctor. Patient also stated he was expecting a different medication to help with his cholesterol aside from the  rosuvastatin  (CRESTOR ) 40 MG tablet  That was sent in patient was not sure of medication name.   CB# [phone]

## 2024-03-26 ENCOUNTER — Other Ambulatory Visit: Payer: Self-pay | Admitting: Medical

## 2024-03-26 NOTE — Telephone Encounter (Signed)
 Pt was changed from Fenofibrate  to another medication from last visit

## 2024-06-13 ENCOUNTER — Ambulatory Visit: Admitting: Medical

## 2025-03-13 ENCOUNTER — Encounter: Admitting: Medical
